# Patient Record
Sex: Female | Born: 1970
Health system: Southern US, Community
[De-identification: ages and names within clinical notes are randomized; demographics above are authoritative.]

## PROBLEM LIST (undated history)

## (undated) DIAGNOSIS — I7300 Raynaud's syndrome without gangrene: Secondary | ICD-10-CM

## (undated) DIAGNOSIS — I10 Essential (primary) hypertension: Secondary | ICD-10-CM

## (undated) DIAGNOSIS — J309 Allergic rhinitis, unspecified: Secondary | ICD-10-CM

## (undated) DIAGNOSIS — M7660 Achilles tendinitis, unspecified leg: Secondary | ICD-10-CM

## (undated) DIAGNOSIS — T7840XA Allergy, unspecified, initial encounter: Secondary | ICD-10-CM

## (undated) HISTORY — DX: Essential (primary) hypertension: I10

## (undated) HISTORY — DX: Allergy, unspecified, initial encounter: T78.40XA

## (undated) HISTORY — DX: Raynaud's syndrome without gangrene: I73.00

## (undated) HISTORY — DX: Allergic rhinitis, unspecified: J30.9

## (undated) HISTORY — DX: Achilles tendinitis, unspecified leg: M76.60

## (undated) HISTORY — PX: DILATION AND CURETTAGE OF UTERUS: SHX78

---

## 2004-12-05 ENCOUNTER — Inpatient Hospital Stay: Payer: Self-pay | Admitting: Unknown Physician Specialty

## 2005-07-05 ENCOUNTER — Ambulatory Visit: Payer: Self-pay | Admitting: Unknown Physician Specialty

## 2008-08-07 ENCOUNTER — Ambulatory Visit: Payer: Self-pay

## 2008-08-12 ENCOUNTER — Ambulatory Visit: Payer: Self-pay

## 2009-09-15 LAB — HM MAMMOGRAPHY: HM MAMMO: NORMAL (ref 0–4)

## 2015-01-11 ENCOUNTER — Ambulatory Visit
Admit: 2015-01-11 | Disposition: A | Discharge status: Home / Self Care | Payer: Self-pay | Attending: Internal Medicine | Admitting: Internal Medicine

## 2015-01-11 LAB — RAPID STREP-A WITH REFLX: Micro Text Report: POSITIVE

## 2016-04-15 ENCOUNTER — Encounter: Payer: Self-pay | Admitting: Family Medicine

## 2016-04-15 ENCOUNTER — Ambulatory Visit (INDEPENDENT_AMBULATORY_CARE_PROVIDER_SITE_OTHER): Payer: BLUE CROSS/BLUE SHIELD | Admitting: Family Medicine

## 2016-04-15 VITALS — BP 129/85 | HR 62 | Temp 98.1°F | Ht 59.0 in | Wt 145.4 lb

## 2016-04-15 DIAGNOSIS — M7662 Achilles tendinitis, left leg: Secondary | ICD-10-CM

## 2016-04-15 MED ORDER — NAPROXEN 500 MG PO TABS: 500.0000 mg | ORAL_TABLET | Freq: Two times a day (BID) | ORAL | Status: DC

## 2016-04-15 NOTE — Progress Notes (Signed)
BP 129/85 mmHg  Pulse 62  Temp(Src) 98.1 F (36.7 C)  Ht 4\' 11"  (1.499 m)  Wt 145 lb 6.4 oz (65.953 kg)  BMI 29.35 kg/m2  SpO2 95%  LMP 03/29/2016 (Approximate)   Subjective:    Patient ID: Sherry Bass, female    DOB: [birth year], 45 y.o.   MRN: [id number]  HPI: Sherry Bass is a 45 y.o. female  Chief Complaint  Patient presents with  . Foot Pain    Left Heel   FOOT PAIN Duration: Since Winter Involved foot: left Mechanism of injury: Running a lot on the treadmill with not such great shoes, feels different from the plantar fasciitis. Location: back of the heel and into the bottom Onset: gradual  Severity: severe  Quality:  burning Frequency: intermittent Radiation: up the leg a little bit Aggravating factors: weight bearing, walking and running  Alleviating factors: rest  Status: worse Treatments attempted: rest, ice, ibuprofen and aleve  Relief with NSAIDs?:  mild Weakness with weight bearing or walking: no Morning stiffness: yes Swelling: no Redness: no Bruising: yes Paresthesias / decreased sensation: yes  Fevers:no  Relevant past medical, surgical, family and social history reviewed and updated as indicated. Interim medical history since our last visit reviewed. Allergies and medications reviewed and updated.  Review of Systems  Constitutional: Negative.   Respiratory: Negative.   Cardiovascular: Negative.   Musculoskeletal: Positive for joint swelling, arthralgias and gait problem. Negative for myalgias, back pain, neck pain and neck stiffness.  Psychiatric/Behavioral: Negative.     Per HPI unless specifically indicated above     Objective:    BP 129/85 mmHg  Pulse 62  Temp(Src) 98.1 F (36.7 C)  Ht 4\' 11"  (1.499 m)  Wt 145 lb 6.4 oz (65.953 kg)  BMI 29.35 kg/m2  SpO2 95%  LMP 03/29/2016 (Approximate)  Wt Readings from Last 3 Encounters:  04/15/16 145 lb 6.4 oz (65.953 kg)  01/07/14 128 lb (58.06 kg)    Physical Exam   Constitutional: She is oriented to person, place, and time. She appears well-developed and well-nourished. No distress.  HENT:  Head: Normocephalic and atraumatic.  Right Ear: Hearing normal.  Left Ear: Hearing normal.  Nose: Nose normal.  Eyes: Conjunctivae and lids are normal. Right eye exhibits no discharge. Left eye exhibits no discharge. No scleral icterus.  Pulmonary/Chest: Effort normal. No respiratory distress.  Musculoskeletal:  + squeeze test, no tenderness to plantar fascia, FROM of the foot, no tenderness to palpation, no swelling, no redness  Neurological: She is alert and oriented to person, place, and time.  Skin: Skin is warm, dry and intact. No rash noted. No erythema. No pallor.  Psychiatric: She has a normal mood and affect. Her speech is normal and behavior is normal. Judgment and thought content normal. Cognition and memory are normal.  Nursing note and vitals reviewed.   Results for orders placed or performed in visit on 04/13/16  HM MAMMOGRAPHY  Result Value Ref Range   HM Mammogram Self Reported Normal 0-4 Bi-Rad, Self Reported Normal      Assessment & Plan:   Problem List Items Addressed This Visit    None    Visit Diagnoses    Tendonitis, Achilles, left    -  Primary    Will start naproxen. Stretches given. Referral to podiatry given today. Call with concerns. RICE.     Relevant Orders    Ambulatory referral to Podiatry        Follow up plan: Return  if symptoms worsen or fail to improve.

## 2016-04-15 NOTE — Patient Instructions (Signed)

## 2016-04-19 ENCOUNTER — Encounter: Payer: Self-pay | Admitting: Podiatry

## 2016-05-03 NOTE — Patient Instructions (Signed)

## 2016-05-04 ENCOUNTER — Encounter: Payer: Self-pay | Admitting: Podiatry

## 2016-05-04 ENCOUNTER — Ambulatory Visit (INDEPENDENT_AMBULATORY_CARE_PROVIDER_SITE_OTHER): Payer: BLUE CROSS/BLUE SHIELD | Admitting: Podiatry

## 2016-05-04 ENCOUNTER — Telehealth: Payer: Self-pay | Admitting: *Deleted

## 2016-05-04 ENCOUNTER — Ambulatory Visit (INDEPENDENT_AMBULATORY_CARE_PROVIDER_SITE_OTHER): Payer: BLUE CROSS/BLUE SHIELD

## 2016-05-04 VITALS — BP 114/88 | HR 69 | Resp 12

## 2016-05-04 DIAGNOSIS — M7662 Achilles tendinitis, left leg: Secondary | ICD-10-CM | POA: Diagnosis not present

## 2016-05-04 MED ORDER — NITROGLYCERIN 0.2 MG/HR TD PT24: 0.2000 mg | MEDICATED_PATCH | Freq: Every day | TRANSDERMAL | 12 refills | Status: DC

## 2016-05-04 MED ORDER — METHYLPREDNISOLONE 4 MG PO TBPK: ORAL_TABLET | ORAL | 0 refills | Status: DC

## 2016-05-04 MED ORDER — MELOXICAM 15 MG PO TABS: 15.0000 mg | ORAL_TABLET | Freq: Every day | ORAL | 3 refills | Status: DC

## 2016-05-04 NOTE — Progress Notes (Signed)
   Subjective:    Patient ID: Sherry Bass, female    DOB: May 11, 1971, 45 y.o.   MRN: [id number]  HPI: She presents today as a new patient with a chief complaint of six-month duration of pain to her left Achilles tendon. She states it really hurts when she runs and she noticed to have began to hurt after she ran a long distance. She's tried different shoe gear to no avail she's tried icing and stabilization to know L.    Review of Systems  Musculoskeletal: Positive for gait problem.       Objective:   Physical Exam: Vital signs are stable alert and oriented 3 and reviewed her past history medications and allergies. No apparent distress. She walks with an antalgic gait to the left. Pulses are strongly palpable. Neurologic sensorium is intact person's once the monofilament. Deep tendon reflexes are intact bilaterally muscle strength +5 over 5 dorsiflexion plantar flexors and inverters and everters all of his musculature is intact. Orthopedic evaluation demonstrates pinpoint tenderness along the tendo Achilles as it inserts on the posterior superior aspect of the calcaneus and posterior lateral aspect. Radiographs 3 views of the left foot taken today do not demonstrate any type of osseus abnormalities or hardly any thickening of the Achilles tendon itself. Cutaneous evaluation does not demonstrates any type of open lesions or wounds.        Assessment & Plan:  Insertional Achilles tendinitis left.  Plan: I placed her on a Sterapred dose pack to be followed by meloxicam. Placed her in a night splint and a Cam Walker. I wrote a prescription also for nitroglycerin patch to be applied daily at the site of tenderness. Follow up with her in 1 month. We did discuss appropriate shoe gear stretching excised and ice therapy and a handout was applied.

## 2016-05-04 NOTE — Telephone Encounter (Signed)
Pt states she was given 3 medications today and wanted to get the instructions right.  I told pt to begin the Medrol dose pack tomorrow so she could take as instructed, once completing the pack she could start the Meloxicam and could begin the nitroglycerin patches today.  Pt states understanding and asked if it was normal for her heel to burn in the boot.  I told her yes it is stretching the tendon, she should rest and ice at least 3-4 times daily for 15-20 minutes each time.

## 2016-05-06 ENCOUNTER — Telehealth: Payer: Self-pay | Admitting: *Deleted

## 2016-05-06 NOTE — Telephone Encounter (Signed)
Pt states she has taken 4 of the steroids and had a violent headache, with nausea and vomiting.  I told pt to stop the steroids and begin the Meloxicam.  I told pt not to take the meloxicam with other NSAIDS, may use Tylenol OTC if tolerates for additional pain coverage.

## 2016-06-01 ENCOUNTER — Ambulatory Visit: Payer: BLUE CROSS/BLUE SHIELD | Admitting: Podiatry

## 2016-06-01 ENCOUNTER — Ambulatory Visit (INDEPENDENT_AMBULATORY_CARE_PROVIDER_SITE_OTHER): Payer: BLUE CROSS/BLUE SHIELD | Admitting: Podiatry

## 2016-06-01 ENCOUNTER — Encounter: Payer: Self-pay | Admitting: Podiatry

## 2016-06-01 DIAGNOSIS — M7662 Achilles tendinitis, left leg: Secondary | ICD-10-CM

## 2016-06-01 NOTE — Progress Notes (Signed)
She presents today for follow-up of Achilles tendinitis of her left heel. She states that she is very much better however she still has radiating pain from the cervical nerve central posterior calf distally. She relates a history of sciatica.  Objective: Vital signs are stable alert and oriented 3 she doesn't have a lot of pain on palpation of the Achilles at its insertion site and it is not warm to the touch as it was previously. She still has radiating pain on palpation of the sural nerve just distal to the aponeurosis.  Assessment: Insertional Achilles tendinitis left. Neuritis left.  Plan: We'll send her to physical therapy for rehabilitation of her Achilles tendon and sciatica.

## 2016-06-10 DIAGNOSIS — M7662 Achilles tendinitis, left leg: Secondary | ICD-10-CM | POA: Diagnosis not present

## 2016-06-15 DIAGNOSIS — M7662 Achilles tendinitis, left leg: Secondary | ICD-10-CM | POA: Diagnosis not present

## 2016-06-17 DIAGNOSIS — M7662 Achilles tendinitis, left leg: Secondary | ICD-10-CM | POA: Diagnosis not present

## 2016-06-22 DIAGNOSIS — M7662 Achilles tendinitis, left leg: Secondary | ICD-10-CM | POA: Diagnosis not present

## 2016-06-24 ENCOUNTER — Encounter: Payer: Self-pay | Admitting: Family Medicine

## 2016-06-24 ENCOUNTER — Ambulatory Visit (INDEPENDENT_AMBULATORY_CARE_PROVIDER_SITE_OTHER): Payer: BLUE CROSS/BLUE SHIELD | Admitting: Family Medicine

## 2016-06-24 VITALS — BP 131/84 | HR 94 | Temp 98.7°F | Ht 58.7 in | Wt 144.0 lb

## 2016-06-24 DIAGNOSIS — R52 Pain, unspecified: Secondary | ICD-10-CM | POA: Diagnosis not present

## 2016-06-24 DIAGNOSIS — J069 Acute upper respiratory infection, unspecified: Secondary | ICD-10-CM

## 2016-06-24 LAB — VERITOR FLU A/B WAIVED
INFLUENZA B: NEGATIVE
Influenza A: NEGATIVE

## 2016-06-24 MED ORDER — BENZONATATE 100 MG PO CAPS: 100.0000 mg | ORAL_CAPSULE | Freq: Two times a day (BID) | ORAL | 0 refills | Status: DC | PRN

## 2016-06-24 MED ORDER — FLUTICASONE PROPIONATE 50 MCG/ACT NA SUSP: 2.0000 | Freq: Every day | NASAL | 6 refills | Status: DC | PRN

## 2016-06-24 MED ORDER — ALBUTEROL SULFATE HFA 108 (90 BASE) MCG/ACT IN AERS: 2.0000 | INHALATION_SPRAY | Freq: Four times a day (QID) | RESPIRATORY_TRACT | 0 refills | Status: DC | PRN

## 2016-06-24 NOTE — Patient Instructions (Signed)
Follow up as needed

## 2016-06-24 NOTE — Progress Notes (Signed)
   BP 131/84 (BP Location: Left Arm, Patient Position: Sitting, Cuff Size: Normal)   Pulse 94   Temp 98.7 F (37.1 C)   Ht 4' 10.7" (1.491 m)   Wt 144 lb (65.3 kg)   LMP 06/22/2016 (Exact Date)   SpO2 94%   BMI 29.38 kg/m    Subjective:    Patient ID: Creig HinesCindy Louise Jeffords, female    DOB: 04-Jan-1971, 45 y.o.   MRN: [id number]  HPI: Creig HinesCindy Louise Brix is a 45 y.o. female  Chief Complaint  Patient presents with  . Generalized Body Aches    fever   Patient presents with body aches and low grade fever (99) that started Monday. Chest tightness, sinus pressure, HA and has a productive cough. Denies sore throat, ear pain, nasal congestion. Has been taking tylenol as needed and flonase. Tried zyrtec a couple of times. 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   Relevant past medical, surgical, family and social history reviewed and updated as indicated. Interim medical history since our last visit reviewed. Allergies and medications reviewed and updated.  Review of Systems  Constitutional: Positive for fever.  HENT: Positive for sinus pressure.   Eyes: Negative.   Respiratory: Positive for cough and wheezing.   Gastrointestinal: Negative.   Genitourinary: Negative.   Musculoskeletal: Positive for myalgias.  Skin: Negative.   Neurological: Positive for headaches.  Psychiatric/Behavioral: Negative.     Per HPI unless specifically indicated above     Objective:    BP 131/84 (BP Location: Left Arm, Patient Position: Sitting, Cuff Size: Normal)   Pulse 94   Temp 98.7 F (37.1 C)   Ht 4' 10.7" (1.491 m)   Wt 144 lb (65.3 kg)   LMP 06/22/2016 (Exact Date)   SpO2 94%   BMI 29.38 kg/m   Wt Readings from Last 3 Encounters:  06/24/16 144 lb (65.3 kg)  04/15/16 145 lb 6.4 oz (66 kg)  01/07/14 128 lb (58.1 kg)    Physical Exam  Constitutional: She is oriented to person, place, and time. She appears well-developed and  well-nourished. No distress.  HENT:  Head: Atraumatic.  Eyes: Conjunctivae are normal. No scleral icterus.  Neck: Normal range of motion. Neck supple.  Cardiovascular: Normal rate and normal heart sounds.   Pulmonary/Chest: Effort normal. No respiratory distress. She has wheezes (mild wheezes throughout).  Musculoskeletal: Normal range of motion.  Neurological: She is alert and oriented to person, place, and time.  Skin: Skin is warm and dry.  Psychiatric: She has a normal mood and affect. Her behavior is normal.  Nursing note and vitals reviewed.     Assessment & Plan:   Problem List Items Addressed This Visit    None    Visit Diagnoses    URI (upper respiratory infection)    -  Primary   Likely viral, flonase, tessalon perles, albuterol, and tylenol as needed. She will follow up if symptoms worsen or fail to improve by early next week   Relevant Orders   Influenza A & B (STAT)       Follow up plan: Return if symptoms worsen or fail to improve.

## 2016-06-27 ENCOUNTER — Telehealth: Payer: Self-pay

## 2016-06-27 DIAGNOSIS — J019 Acute sinusitis, unspecified: Secondary | ICD-10-CM | POA: Diagnosis not present

## 2016-06-27 MED ORDER — AMOXICILLIN-POT CLAVULANATE 875-125 MG PO TABS: 1.0000 | ORAL_TABLET | Freq: Two times a day (BID) | ORAL | 0 refills | Status: DC

## 2016-06-27 NOTE — Telephone Encounter (Signed)
She was seen on Friday and left message stating that she's no better and would like a rx for Amoxil.

## 2016-06-27 NOTE — Telephone Encounter (Signed)
Augmentin sent.

## 2016-06-30 DIAGNOSIS — M7662 Achilles tendinitis, left leg: Secondary | ICD-10-CM | POA: Diagnosis not present

## 2016-07-01 DIAGNOSIS — J189 Pneumonia, unspecified organism: Secondary | ICD-10-CM | POA: Diagnosis not present

## 2016-07-07 DIAGNOSIS — M7662 Achilles tendinitis, left leg: Secondary | ICD-10-CM | POA: Diagnosis not present

## 2016-07-08 DIAGNOSIS — J189 Pneumonia, unspecified organism: Secondary | ICD-10-CM | POA: Diagnosis not present

## 2016-07-12 DIAGNOSIS — M7662 Achilles tendinitis, left leg: Secondary | ICD-10-CM | POA: Diagnosis not present

## 2016-07-14 DIAGNOSIS — M7662 Achilles tendinitis, left leg: Secondary | ICD-10-CM | POA: Diagnosis not present

## 2016-07-22 DIAGNOSIS — M7662 Achilles tendinitis, left leg: Secondary | ICD-10-CM | POA: Diagnosis not present

## 2016-07-25 DIAGNOSIS — J189 Pneumonia, unspecified organism: Secondary | ICD-10-CM | POA: Diagnosis not present

## 2016-07-25 DIAGNOSIS — Z3200 Encounter for pregnancy test, result unknown: Secondary | ICD-10-CM | POA: Diagnosis not present

## 2016-07-26 ENCOUNTER — Ambulatory Visit
Admission: EM | Admit: 2016-07-26 | Discharge: 2016-07-26 | Disposition: A | Discharge status: Home / Self Care | Payer: BLUE CROSS/BLUE SHIELD | Attending: Family Medicine | Admitting: Family Medicine

## 2016-07-26 DIAGNOSIS — J189 Pneumonia, unspecified organism: Secondary | ICD-10-CM

## 2016-07-26 MED ORDER — LEVOFLOXACIN 500 MG PO TABS: 500.0000 mg | ORAL_TABLET | Freq: Every day | ORAL | 0 refills | Status: DC

## 2016-07-26 NOTE — ED Provider Notes (Signed)
[facility]    CSN: [id number] Arrival date & time: 07/26/16  1251     History   Chief Complaint Chief Complaint  Patient presents with  . Pneumonia    HPI Sherry Bass is a 45 y.o. female.   45 yo female with a c/o productive cough, fevers, fatigue. States was seen by PCP in the past several weeks and treated with amoxicillin for pneumonia. Patient states she improved only slightly, went to urgent care and had a repeat chest x-ray showing pneumonia. Patient states she offered prednisone but that she can't take that.     The history is provided by the patient.    Past Medical History:  Diagnosis Date  . Allergic rhinitis     There are no active problems to display for this patient.   Past Surgical History:  Procedure Laterality Date  . CESAREAN SECTION     x 2    OB History    No data available       Home Medications    Prior to Admission medications   Medication Sig Start Date End Date Taking? Authorizing Provider  albuterol (PROVENTIL HFA;VENTOLIN HFA) 108 (90 Base) MCG/ACT inhaler Inhale 2 puffs into the lungs every 6 (six) hours as needed for wheezing or shortness of breath. 06/24/16  Yes Particia Nearing, PA-C  fluticasone [facility]) 50 MCG/ACT nasal spray Place 2 sprays into both nostrils daily as needed. 06/24/16  Yes Particia Nearing, PA-C  meloxicam (MOBIC) 15 MG tablet Take 1 tablet (15 mg total) by mouth daily. 05/04/16  Yes Max T Hyatt, DPM  amoxicillin-clavulanate (AUGMENTIN) 875-125 MG tablet Take 1 tablet by mouth 2 (two) times daily. 06/27/16   Particia Nearing, PA-C  benzonatate (TESSALON) 100 MG capsule Take 1 capsule (100 mg total) by mouth 2 (two) times daily as needed for cough. 06/24/16   Particia Nearing, PA-C  levofloxacin (LEVAQUIN) 500 MG tablet Take 1 tablet (500 mg total) by mouth daily. 07/26/16   Payton Mccallum, MD  nitroGLYCERIN (NITRODUR - DOSED IN MG/24 HR) 0.2 mg/hr patch Place 1 patch (0.2 mg  total) onto the skin daily. 05/04/16   Max Maud Deed, DPM    Family History Family History  Problem Relation Age of Onset  . Diabetes Mother   . Hypertension Mother   . Hyperlipidemia Father     Social History Social History  Substance Use Topics  . Smoking status: Never Smoker  . Smokeless tobacco: Never Used  . Alcohol use No     Allergies   Prednisone   Review of Systems Review of Systems   Physical Exam Triage Vital Signs ED Triage Vitals  Enc Vitals Group     BP 07/26/16 1339 (!) 163/88     Pulse Rate 07/26/16 1339 74     Resp 07/26/16 1339 18     Temp 07/26/16 1339 97.7 F (36.5 C)     Temp Source 07/26/16 1339 Oral     SpO2 07/26/16 1339 99 %     Weight 07/26/16 1338 140 lb (63.5 kg)     Height 07/26/16 1338 4\' 10"  (1.473 m)     Head Circumference --      Peak Flow --      Pain Score 07/26/16 1341 7     Pain Loc --      Pain Edu? --      Excl. in GC? --    No data found.   Updated Vital Signs  BP (!) 163/88 (BP Location: Right Arm)   Pulse 74   Temp 97.7 F (36.5 C) (Oral)   Resp 18   Ht 4\' 10"  (1.473 m)   Wt 140 lb (63.5 kg)   LMP 07/20/2016   SpO2 99%   BMI 29.26 kg/m   Visual Acuity Right Eye Distance:   Left Eye Distance:   Bilateral Distance:    Right Eye Near:   Left Eye Near:    Bilateral Near:     Physical Exam  Constitutional: She appears well-developed and well-nourished. No distress.  HENT:  Head: Normocephalic and atraumatic.  Right Ear: Tympanic membrane, external ear and ear canal normal.  Left Ear: Tympanic membrane, external ear and ear canal normal.  Nose: Mucosal edema and rhinorrhea present. No nose lacerations, sinus tenderness, nasal deformity, septal deviation or nasal septal hematoma. No epistaxis.  No foreign bodies. Right sinus exhibits maxillary sinus tenderness and frontal sinus tenderness. Left sinus exhibits maxillary sinus tenderness and frontal sinus tenderness.  Mouth/Throat: Uvula is midline,  oropharynx is clear and moist and mucous membranes are normal. No oropharyngeal exudate.  Eyes: Conjunctivae and EOM are normal. Pupils are equal, round, and reactive to light. Right eye exhibits no discharge. Left eye exhibits no discharge. No scleral icterus.  Neck: Normal range of motion. Neck supple. No thyromegaly present.  Cardiovascular: Normal rate, regular rhythm and normal heart sounds.   Pulmonary/Chest: Effort normal. No respiratory distress. She has no wheezes. She has rales (left lower lung).  Lymphadenopathy:    She has no cervical adenopathy.  Skin: She is not diaphoretic.  Nursing note and vitals reviewed.    UC Treatments / Results  Labs (all labs ordered are listed, but only abnormal results are displayed) Labs Reviewed - No data to display  EKG  EKG Interpretation None       Radiology No results found.  Procedures Procedures (including critical care time)  Medications Ordered in UC Medications - No data to display   Initial Impression / Assessment and Plan / UC Course  I have reviewed the triage vital signs and the nursing notes.  Pertinent labs & imaging results that were available during my care of the patient were reviewed by me and considered in my medical decision making (see chart for details).  Clinical Course      Final Clinical Impressions(s) / UC Diagnoses   Final diagnoses:  Community acquired pneumonia, unspecified laterality    New Prescriptions Discharge Medication List as of 07/26/2016  2:05 PM    START taking these medications   Details  levofloxacin (LEVAQUIN) 500 MG tablet Take 1 tablet (500 mg total) by mouth daily., Starting Tue 07/26/2016, Normal       1. diagnosis reviewed with patient/parent/guardian/family 2. rx as per orders above; reviewed possible side effects, interactions, risks and benefits  3. Recommend supportive treatment with rest, fluids 4. Follow-up with PCP in 2-3 weeks or sooner prn if symptoms  worsen or don't improve   Payton Mccallumrlando Kamron Portee, MD 07/26/16 1547

## 2016-07-26 NOTE — ED Triage Notes (Signed)
Pt c/o having pneumonia. She has been feeling bad for over a month. She has seen her PCP Dr Aline Augustrissmon and he said her O2 was low and perscribed her an inhaler. She didn't feel any better in a few days so [facility] and they did 2 chest xray and diagnosis her with pneumonia, they gave her amoxicillin, mucinex, and inhaler. She finished the antibiotic however didn't feel any better, so [facility] and they did another chest x ray and it showed she did still have pneumonia. They wanted to start more antibiotic and steroids, however she cant take prednisone, because it makes her vomit, so at that time they said well you will have to just wait it out then.

## 2016-07-29 ENCOUNTER — Telehealth: Payer: Self-pay | Admitting: *Deleted

## 2016-07-29 NOTE — Telephone Encounter (Signed)
Courtesy call back, no answer, unable to leave message, voicemail unavailable.

## 2016-08-25 DIAGNOSIS — J189 Pneumonia, unspecified organism: Secondary | ICD-10-CM | POA: Diagnosis not present

## 2016-08-25 DIAGNOSIS — Z3200 Encounter for pregnancy test, result unknown: Secondary | ICD-10-CM | POA: Diagnosis not present

## 2017-05-15 ENCOUNTER — Telehealth: Payer: Self-pay | Admitting: Unknown Physician Specialty

## 2017-05-15 MED ORDER — FLUTICASONE PROPIONATE 50 MCG/ACT NA SUSP: 2.0000 | Freq: Every day | NASAL | 12 refills | Status: DC

## 2017-05-15 NOTE — Telephone Encounter (Signed)
Routing to provider. Patient last seen by Sherry Bass 12/2013. Patient saw [name] 06/2016.

## 2017-05-15 NOTE — Telephone Encounter (Signed)
Patient called to see if Elnita MaxwellCheryl would send her in a script for her fluticasone (flonase)  [facility]  [place] [phone]  Thanks

## 2017-11-16 ENCOUNTER — Ambulatory Visit (INDEPENDENT_AMBULATORY_CARE_PROVIDER_SITE_OTHER): Payer: BLUE CROSS/BLUE SHIELD | Admitting: Obstetrics and Gynecology

## 2017-11-16 ENCOUNTER — Other Ambulatory Visit: Payer: Self-pay | Admitting: Obstetrics and Gynecology

## 2017-11-16 ENCOUNTER — Encounter: Payer: Self-pay | Admitting: Obstetrics and Gynecology

## 2017-11-16 VITALS — BP 130/90 | Ht <= 58 in | Wt 157.0 lb

## 2017-11-16 DIAGNOSIS — Z1321 Encounter for screening for nutritional disorder: Secondary | ICD-10-CM | POA: Diagnosis not present

## 2017-11-16 DIAGNOSIS — Z01419 Encounter for gynecological examination (general) (routine) without abnormal findings: Secondary | ICD-10-CM

## 2017-11-16 DIAGNOSIS — Z13220 Encounter for screening for lipoid disorders: Secondary | ICD-10-CM

## 2017-11-16 DIAGNOSIS — Z0000 Encounter for general adult medical examination without abnormal findings: Secondary | ICD-10-CM

## 2017-11-16 DIAGNOSIS — N6311 Unspecified lump in the right breast, upper outer quadrant: Secondary | ICD-10-CM | POA: Diagnosis not present

## 2017-11-16 DIAGNOSIS — Z131 Encounter for screening for diabetes mellitus: Secondary | ICD-10-CM

## 2017-11-16 DIAGNOSIS — Z1231 Encounter for screening mammogram for malignant neoplasm of breast: Secondary | ICD-10-CM | POA: Diagnosis not present

## 2017-11-16 DIAGNOSIS — Z1151 Encounter for screening for human papillomavirus (HPV): Secondary | ICD-10-CM

## 2017-11-16 DIAGNOSIS — Z124 Encounter for screening for malignant neoplasm of cervix: Secondary | ICD-10-CM

## 2017-11-16 DIAGNOSIS — Z1239 Encounter for other screening for malignant neoplasm of breast: Secondary | ICD-10-CM

## 2017-11-16 NOTE — Patient Instructions (Signed)
I value your feedback and entrusting us with your care. If you get a [facility] patient survey, I would appreciate you taking the time to let us know about your experience today. Thank you! 

## 2017-11-16 NOTE — Progress Notes (Signed)
PCP:  Gabriel CirriWicker, Cheryl, NP   Chief Complaint  Patient presents with  . Gynecologic Exam     HPI:      Sherry Bass is a 47 y.o. [id number] who LMP was Patient's last menstrual period was 10/23/2017 (exact date)., presents today for her NP annual examination.  Her menses are regular every 28-30 days, lasting 4 days.  Dysmenorrhea none. She does not have intermenstrual bleeding.  Sex activity: single partner, contraception - vasectomy.  Last Pap: not recent; no hx of abn Hx of STDs: none  Last mammogram: not recent; had abn mammo years ago that was normal [facility].  There is no FH of breast cancer. There is no FH of ovarian cancer. The patient does do regular self-breast exams. She noted a breast mass last night RT UOQ while doing SBE. Not tender, no redness, nipple d/c.   Tobacco use: The patient denies current or previous tobacco use. Alcohol use: none No drug use.  Exercise: moderately active  She does get adequate calcium and Vitamin D in her diet.  No recent labs.  Past Medical History:  Diagnosis Date  . Achilles tendonitis   . Allergic rhinitis     Past Surgical History:  Procedure Laterality Date  . CESAREAN SECTION     x 2    Family History  Problem Relation Age of Onset  . Diabetes Mother   . Hypertension Mother   . Hyperlipidemia Father     Social History   Socioeconomic History  . Marital status: Married    Spouse name: Not on file  . Number of children: Not on file  . Years of education: Not on file  . Highest education level: Not on file  Social Needs  . Financial resource strain: Not on file  . Food insecurity - worry: Not on file  . Food insecurity - inability: Not on file  . Transportation needs - medical: Not on file  . Transportation needs - non-medical: Not on file  Occupational History  . Not on file  Tobacco Use  . Smoking status: Never Smoker  . Smokeless tobacco: Never Used  Substance and Sexual  Activity  . Alcohol use: No  . Drug use: No  . Sexual activity: Yes    Birth control/protection: None, Other-see comments    Comment: vasectomy  Other Topics Concern  . Not on file  Social History Narrative  . Not on file    Current Meds  Medication Sig  . fluticasone (FLONASE) 50 MCG/ACT nasal spray Place 2 sprays into both nostrils daily.     ROS:  Review of Systems  Constitutional: Negative for fatigue, fever and unexpected weight change.  Respiratory: Negative for cough, shortness of breath and wheezing.   Cardiovascular: Negative for chest pain, palpitations and leg swelling.  Gastrointestinal: Negative for blood in stool, constipation, diarrhea, nausea and vomiting.  Endocrine: Negative for cold intolerance, heat intolerance and polyuria.  Genitourinary: Negative for dyspareunia, dysuria, flank pain, frequency, genital sores, hematuria, menstrual problem, pelvic pain, urgency, vaginal bleeding, vaginal discharge and vaginal pain.  Musculoskeletal: Negative for back pain, joint swelling and myalgias.  Skin: Negative for rash.  Neurological: Negative for dizziness, syncope, light-headedness, numbness and headaches.  Hematological: Negative for adenopathy.  Psychiatric/Behavioral: Negative for agitation, confusion, sleep disturbance and suicidal ideas. The patient is not nervous/anxious.   Breast ROS: positive for - new or changing breast lumps    Objective: BP 130/90   Ht 4'  9.5" (1.461 m)   Wt 157 lb (71.2 kg)   LMP 10/23/2017 (Exact Date)   BMI 33.39 kg/m    Physical Exam  Constitutional: She is oriented to person, place, and time. She appears well-developed and well-nourished.  Genitourinary: Vagina normal and uterus normal. There is no rash or tenderness on the right labia. There is no rash or tenderness on the left labia. No erythema or tenderness in the vagina. No vaginal discharge found. Right adnexum does not display mass and does not display tenderness.  Left adnexum does not display mass and does not display tenderness. Cervix does not exhibit motion tenderness or polyp. Uterus is not enlarged or tender.  Neck: Normal range of motion. No thyromegaly present.  Cardiovascular: Normal rate, regular rhythm and normal heart sounds.  No murmur heard. Pulmonary/Chest: Effort normal and breath sounds normal. Right breast exhibits mass. Right breast exhibits no nipple discharge, no skin change and no tenderness. Left breast exhibits no mass, no nipple discharge, no skin change and no tenderness.  ~4X5 CM FIRM, MOBILE, NT MASS RT BREAST 10:00-11:00 POS    Abdominal: Soft. There is no tenderness. There is no guarding.  Musculoskeletal: Normal range of motion.  Neurological: She is alert and oriented to person, place, and time. No cranial nerve deficit.  Skin: Skin is warm and dry.  Psychiatric: She has a normal mood and affect. Her behavior is normal. Thought content normal.  Vitals reviewed.   Assessment/Plan: Encounter for annual routine gynecological examination  Cervical cancer screening - Plan: IGP, Aptima HPV  Screening for HPV (human papillomavirus) - Plan: IGP, Aptima HPV  Screening for breast cancer - Plan: MM DIAG BREAST TOMO BILATERAL  Mass of upper outer quadrant of right breast - Check dx mammo and u/s. Pt prefers [facility]. Will f/u with results.  - Plan: US BREAST LTD UNI RIGHT INC AXILLA, MM DIAG BREAST TOMO BILATERAL  Blood tests for routine general physical examination - Plan: Comprehensive metabolic panel, Lipid panel, VITAMIN D 25 Hydroxy (Vit-D Deficiency, Fractures), Hemoglobin A1c  Screening cholesterol level - Plan: Lipid panel  Screening for diabetes mellitus - Plan: Hemoglobin A1c  Encounter for vitamin deficiency screening - Plan: VITAMIN D 25 Hydroxy (Vit-D Deficiency, Fractures)   GYN counsel breast self exam, mammography screening, adequate intake of calcium and vitamin D, diet and exercise     F/U  Return in  about 1 year (around 11/16/2018).  Sherry B. Copland, PA-C 11/16/2017 4:34 PM

## 2017-11-20 LAB — IGP, APTIMA HPV
HPV Aptima: NEGATIVE
PAP Smear Comment: 0

## 2017-12-01 DIAGNOSIS — N6311 Unspecified lump in the right breast, upper outer quadrant: Secondary | ICD-10-CM | POA: Diagnosis not present

## 2017-12-05 ENCOUNTER — Ambulatory Visit: Payer: BLUE CROSS/BLUE SHIELD | Admitting: Certified Nurse Midwife

## 2018-10-02 ENCOUNTER — Other Ambulatory Visit: Payer: Self-pay

## 2018-10-02 ENCOUNTER — Encounter: Payer: Self-pay | Admitting: Emergency Medicine

## 2018-10-02 ENCOUNTER — Ambulatory Visit
Admission: EM | Admit: 2018-10-02 | Discharge: 2018-10-02 | Disposition: A | Discharge status: Home / Self Care | Payer: BLUE CROSS/BLUE SHIELD | Attending: Family Medicine | Admitting: Family Medicine

## 2018-10-02 DIAGNOSIS — J020 Streptococcal pharyngitis: Secondary | ICD-10-CM | POA: Insufficient documentation

## 2018-10-02 DIAGNOSIS — J069 Acute upper respiratory infection, unspecified: Secondary | ICD-10-CM

## 2018-10-02 LAB — RAPID STREP SCREEN (MED CTR MEBANE ONLY): STREPTOCOCCUS, GROUP A SCREEN (DIRECT): POSITIVE — AB

## 2018-10-02 MED ORDER — AMOXICILLIN 875 MG PO TABS: 875.0000 mg | ORAL_TABLET | Freq: Two times a day (BID) | ORAL | 0 refills | Status: DC

## 2018-10-02 NOTE — ED Triage Notes (Signed)
Patient c/o sore throat, cough and congestion that started 3 days ago.  Patient reports low grade fevers.

## 2018-10-02 NOTE — Discharge Instructions (Addendum)
Take medication as prescribed. Rest. Drink plenty of fluids. Over the counter as discussed.  ° °Follow up with your primary care physician this week as needed. Return to Urgent care for new or worsening concerns.  ° °

## 2018-10-02 NOTE — ED Provider Notes (Signed)
[facility] ____________________________________________  Time seen: Approximately 08:30 AM  I have reviewed the triage vital signs and the nursing notes.   HISTORY  Chief Complaint Sore Throat (APPT) and Cough   HPI Sherry Bass is a 47 y.o. female presenting for evaluation of nasal congestion, cough and sore throat present for the last 3 days.  Reports yesterday sore throat increasingly worsened.  States sore throat is currently moderate and does hurt to swallow.  Has continued to drink fluids well, decreased appetite.  Also reports continued nasal congestion and cough.  Intermittent low-grade fevers, which patient reports around 99.  Has taken over-the-counter allergy medication as well as decongestants without resolution.  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  Denies other known sick contacts.  Denies chest pain, shortness breath, abdominal pain or recent sickness.  Reports otherwise doing well.  Gabriel CirriWicker, Cheryl, NP: PCP    Past Medical History:  Diagnosis Date  . Achilles tendonitis   . Allergic rhinitis     There are no active problems to display for this patient.   Past Surgical History:  Procedure Laterality Date  . CESAREAN SECTION     x 2     No current facility-administered medications for this encounter.   Current Outpatient Medications:  .  fluticasone (FLONASE) 50 MCG/ACT nasal spray, Place 2 sprays into both nostrils daily., Disp: 16 g, Rfl: 12 .  amoxicillin (AMOXIL) 875 MG tablet, Take 1 tablet (875 mg total) by mouth 2 (two) times daily., Disp: 20 tablet, Rfl: 0  Allergies Prednisone  Family History  Problem Relation Age of Onset  . Diabetes Mother   . Hypertension Mother   . Hyperlipidemia Father     Social History Social History   Tobacco Use  . Smoking status: Never Smoker  . Smokeless tobacco: Never Used  Substance Use Topics  . Alcohol use: No  . Drug use: No    Review of  Systems Constitutional: Positive reported fevers. ENT: Positive sore throat. Cardiovascular: Denies chest pain. Respiratory: Denies shortness of breath. Gastrointestinal: No abdominal pain.  Musculoskeletal: Negative for back pain. Skin: Negative for rash.  ____________________________________________   PHYSICAL EXAM:  VITAL SIGNS: ED Triage Vitals  Enc Vitals Group     BP 10/02/18 0820 (!) 140/91     Pulse Rate 10/02/18 0820 (!) 104     Resp 10/02/18 0820 16     Temp 10/02/18 0820 98.2 F (36.8 C)     Temp Source 10/02/18 0820 Oral     SpO2 10/02/18 0820 97 %     Weight 10/02/18 0817 152 lb (68.9 kg)     Height 10/02/18 0817 4\' 9"  (1.448 m)     Head Circumference --      Peak Flow --      Pain Score 10/02/18 0817 8     Pain Loc --      Pain Edu? --      Excl. in GC? --     Constitutional: Alert and oriented. Well appearing and in no acute distress. Eyes: Conjunctivae are normal.  Head: Atraumatic. No sinus tenderness to palpation. No swelling. No erythema.  Ears: no erythema, normal TMs bilaterally.   Nose:Nasal congestion  Mouth/Throat: Mucous membranes are moist.moderate pharyngeal erythema.  2+ bilateral tonsillar swelling.  No exudate.  No uvular shift or deviation. Neck: No stridor.  No cervical spine tenderness to palpation. Hematological/Lymphatic/Immunilogical: Anterior bilateral cervical lymphadenopathy. Cardiovascular: Normal rate, regular rhythm. Grossly normal  heart sounds.  Good peripheral circulation. Respiratory: Normal respiratory effort.  No retractions. No wheezes, rales or rhonchi. Good air movement.  Occasional dry cough noted in room. Musculoskeletal: Ambulatory with steady gait Neurologic:  Normal speech and language. No gait instability. Skin:  Skin appears warm, dry and intact. No rash noted. Psychiatric: Mood and affect are normal. Speech and behavior are normal. ___________________________________________   LABS (all labs ordered are  listed, but only abnormal results are displayed)  Labs Reviewed  RAPID STREP SCREEN ([facility] ONLY) - Abnormal; Notable for the following components:      Result Value   Streptococcus, Group A Screen (Direct) POSITIVE (*)    All other components within normal limits   ____________________________________________   PROCEDURES Procedures     INITIAL IMPRESSION / ASSESSMENT AND PLAN / ED COURSE  Pertinent labs & imaging results that were available during my care of the patient were reviewed by me and considered in my medical decision making (see chart for details).  Overall well-appearing patient.  No acute distress.  Strep positive.  Discussed treatment options with patient, will treat with oral amoxicillin.  Also suspect viral upper respiratory infection, encouraged over-the-counter supportive care.  Rest, fluids.  Discussed indication, risks and benefits of medications with patient.  Discussed follow up with Primary care physician this week as needed. Discussed follow up and return parameters including no resolution or any worsening concerns. Patient verbalized understanding and agreed to plan.   ____________________________________________   FINAL CLINICAL IMPRESSION(S) / ED DIAGNOSES  Final diagnoses:  Strep pharyngitis  Upper respiratory tract infection, unspecified type     ED Discharge Orders         Ordered    amoxicillin (AMOXIL) 875 MG tablet  2 times daily     10/02/18 0849           Note: This dictation was prepared with Dragon dictation along with smaller phrase technology. Any transcriptional errors that result from this process are unintentional.         Renford DillsMiller, Yuto Cajuste, NP 10/02/18 1032

## 2018-10-11 ENCOUNTER — Ambulatory Visit
Admission: EM | Admit: 2018-10-11 | Discharge: 2018-10-11 | Disposition: A | Discharge status: Home / Self Care | Payer: BLUE CROSS/BLUE SHIELD | Attending: Family Medicine | Admitting: Family Medicine

## 2018-10-11 ENCOUNTER — Ambulatory Visit (INDEPENDENT_AMBULATORY_CARE_PROVIDER_SITE_OTHER): Payer: BLUE CROSS/BLUE SHIELD

## 2018-10-11 DIAGNOSIS — R05 Cough: Secondary | ICD-10-CM

## 2018-10-11 DIAGNOSIS — J189 Pneumonia, unspecified organism: Secondary | ICD-10-CM

## 2018-10-11 MED ORDER — BENZONATATE 200 MG PO CAPS: ORAL_CAPSULE | ORAL | 0 refills | Status: DC

## 2018-10-11 MED ORDER — LEVOFLOXACIN 500 MG PO TABS: 500.0000 mg | ORAL_TABLET | Freq: Every day | ORAL | 0 refills | Status: DC

## 2018-10-11 MED ORDER — ALBUTEROL SULFATE 108 (90 BASE) MCG/ACT IN AEPB: 2.0000 | INHALATION_SPRAY | Freq: Four times a day (QID) | RESPIRATORY_TRACT | 0 refills | Status: DC | PRN

## 2018-10-11 NOTE — ED Triage Notes (Signed)
Pt states since christmas eve still battling the cough and chest tightness. Has 1 day left on her antibiotics.

## 2018-10-11 NOTE — ED Provider Notes (Signed)
[facility]    CSN: [id number] Arrival date & time: 10/11/18  0858     History   Chief Complaint Chief Complaint  Patient presents with  . Cough    Appointment    HPI Sherry Bass is a 48 y.o. female.   HPI  -year-old female presents that she has started having cough symptoms in around Thanksgiving.  She was exposed to incense type smoke which precipitated the cough.  Right Christmas time diagnosed with a strep pharyngitis placed on ampicillin which she has 1 more dose left to take.  Despite all this she continues to have the cough and chest congestion.  Noticed a rattling sound whenever she is recumbent at night.  She states that she has been coughing all day yesterday and  today.  Is not running fever or chills.  O2 sats today are 97% on room air.  She has never smoked.  Denies any sinus problems.         Past Medical History:  Diagnosis Date  . Achilles tendonitis   . Allergic rhinitis     There are no active problems to display for this patient.   Past Surgical History:  Procedure Laterality Date  . CESAREAN SECTION     x 2    OB History    Gravida  3   Para  2   Term  2   Preterm      AB  1   Living  2     SAB  1   TAB      Ectopic      Multiple      Live Births  2            Home Medications    Prior to Admission medications   Medication Sig Start Date End Date Taking? Authorizing Provider  amoxicillin (AMOXIL) 875 MG tablet Take 1 tablet (875 mg total) by mouth 2 (two) times daily. 10/02/18  Yes Renford Dills, NP  fluticasone [facility]) 50 MCG/ACT nasal spray Place 2 sprays into both nostrils daily. 05/15/17  Yes Gabriel Cirri, NP  Albuterol Sulfate (PROAIR RESPICLICK) 108 (90 Base) MCG/ACT AEPB Inhale 2 puffs into the lungs 4 (four) times daily as needed. Use spacer 10/11/18   Lutricia Feil, PA-C  benzonatate (TESSALON) 200 MG capsule Take one cap TID PRN cough 10/11/18   Lutricia Feil, PA-C    levofloxacin (LEVAQUIN) 500 MG tablet Take 1 tablet (500 mg total) by mouth daily. 10/11/18   Lutricia Feil, PA-C    Family History Family History  Problem Relation Age of Onset  . Diabetes Mother   . Hypertension Mother   . Hyperlipidemia Father     Social History Social History   Tobacco Use  . Smoking status: Never Smoker  . Smokeless tobacco: Never Used  Substance Use Topics  . Alcohol use: No  . Drug use: No     Allergies   Prednisone   Review of Systems Review of Systems  Constitutional: Positive for activity change. Negative for appetite change, chills, fatigue and fever.  HENT: Positive for congestion and sore throat.   Respiratory: Positive for cough and wheezing.   All other systems reviewed and are negative.    Physical Exam Triage Vital Signs ED Triage Vitals  Enc Vitals Group     BP 10/11/18 0908 (!) 158/108     Pulse Rate 10/11/18 0908 87     Resp 10/11/18 0908 18  Temp 10/11/18 0908 97.7 F (36.5 C)     Temp Source 10/11/18 0908 Oral     SpO2 10/11/18 0908 97 %     Weight 10/11/18 0910 150 lb (68 kg)     Height 10/11/18 0910 4\' 10"  (1.473 m)     Head Circumference --      Peak Flow --      Pain Score 10/11/18 0910 0     Pain Loc --      Pain Edu? --      Excl. in GC? --    No data found.  Updated Vital Signs BP (!) 158/108 (BP Location: Left Arm)   Pulse 87   Temp 97.7 F (36.5 C) (Oral)   Resp 18   Ht 4\' 10"  (1.473 m)   Wt 150 lb (68 kg)   LMP 10/05/2018 (Exact Date)   SpO2 97%   BMI 31.35 kg/m   Visual Acuity Right Eye Distance:   Left Eye Distance:   Bilateral Distance:    Right Eye Near:   Left Eye Near:    Bilateral Near:     Physical Exam Vitals signs and nursing note reviewed.  Constitutional:      General: She is not in acute distress.    Appearance: Normal appearance. She is not ill-appearing, toxic-appearing or diaphoretic.  HENT:     Head: Normocephalic.     Right Ear: Tympanic membrane and ear  canal normal.     Left Ear: Tympanic membrane and ear canal normal.     Nose: Nose normal.     Mouth/Throat:     Mouth: Mucous membranes are moist.     Pharynx: No oropharyngeal exudate or posterior oropharyngeal erythema.  Eyes:     General:        Right eye: No discharge.        Left eye: No discharge.     Conjunctiva/sclera: Conjunctivae normal.     Pupils: Pupils are equal, round, and reactive to light.  Neck:     Musculoskeletal: Normal range of motion and neck supple.  Pulmonary:     Effort: Pulmonary effort is normal.     Breath sounds: Rales present.     Comments: With bibasilar crackles loudest and more coarse in the left lower field Musculoskeletal: Normal range of motion.  Lymphadenopathy:     Cervical: No cervical adenopathy.  Skin:    General: Skin is warm and dry.  Neurological:     General: No focal deficit present.     Mental Status: She is alert and oriented to person, place, and time.  Psychiatric:        Mood and Affect: Mood normal.        Behavior: Behavior normal.        Thought Content: Thought content normal.        Judgment: Judgment normal.      UC Treatments / Results  Labs (all labs ordered are listed, but only abnormal results are displayed) Labs Reviewed - No data to display  EKG None  Radiology Dg Chest 2 View  Result Date: 10/11/2018 CLINICAL DATA:  Cough for several weeks EXAM: CHEST - 2 VIEW COMPARISON:  None. FINDINGS: There is airspace consolidation in a portion of the lingula. The lungs elsewhere are clear. Heart size and pulmonary vascularity are normal. No adenopathy. No bone lesions. IMPRESSION: Airspace consolidation consistent with pneumonia in a portion of the lingula. Lungs elsewhere clear. No evident adenopathy. These results will be  called to the ordering clinician or representative by the Radiologist Assistant, and communication documented in the PACS or zVision Dashboard. Electronically Signed   By: Bretta BangWilliam  Woodruff III  M.D.   On: 10/11/2018 09:55    Procedures Procedures (including critical care time)  Medications Ordered in UC Medications - No data to display  Initial Impression / Assessment and Plan / UC Course  I have reviewed the triage vital signs and the nursing notes.  Pertinent labs & imaging results that were available during my care of the patient were reviewed by me and considered in my medical decision making (see chart for details).     Reviewed the x-rays with the patient.  She has air space consolidation consistent with pneumonia in a portion of the lingula.  Offered her several treatment options including third-generation cephalosporin and a Z-Pak, or Levaquin.  She states that Levaquin has helped her before.  She is aware of black box warnings.  Despite this  she is willing to accept that risk for taking a single antibiotics.  I have given her a prescription for albuterol because of shortness of breath as well as Tessalon Perles for coughing.  She will rest and drink plenty of fluids.  She has taken Levaquin in the past for her previous pneumonia.  She will follow-up with her primary care physician Dr. Shella Spearinghrismon in 4 weeks for another x-ray for proof of cure. Final Clinical Impressions(s) / UC Diagnoses   Final diagnoses:  Community acquired pneumonia of left lung, unspecified part of lung   Discharge Instructions   None    ED Prescriptions    Medication Sig Dispense Auth. Provider   levofloxacin (LEVAQUIN) 500 MG tablet Take 1 tablet (500 mg total) by mouth daily. 7 tablet Ovid Curdoemer, Jahayra Mazo P, PA-C   Albuterol Sulfate (PROAIR RESPICLICK) 108 (90 Base) MCG/ACT AEPB Inhale 2 puffs into the lungs 4 (four) times daily as needed. Use spacer 1 each Lutricia Feiloemer, Quanisha Drewry P, PA-C   benzonatate (TESSALON) 200 MG capsule Take one cap TID PRN cough 30 capsule Lutricia Feiloemer, Tanner Vigna P, PA-C     Controlled Substance Prescriptions  [place] Controlled Substance Registry consulted? Not Applicable   Lutricia FeilRoemer, Keyante Durio  P, PA-C 10/11/18 1025

## 2020-03-09 IMAGING — CR DG CHEST 2V
2 series · 2 of 2 positions shown · non-contrast
Comparison: None.

CLINICAL DATA: Cough for several weeks

EXAM:
CHEST - 2 VIEW

[chest pa]
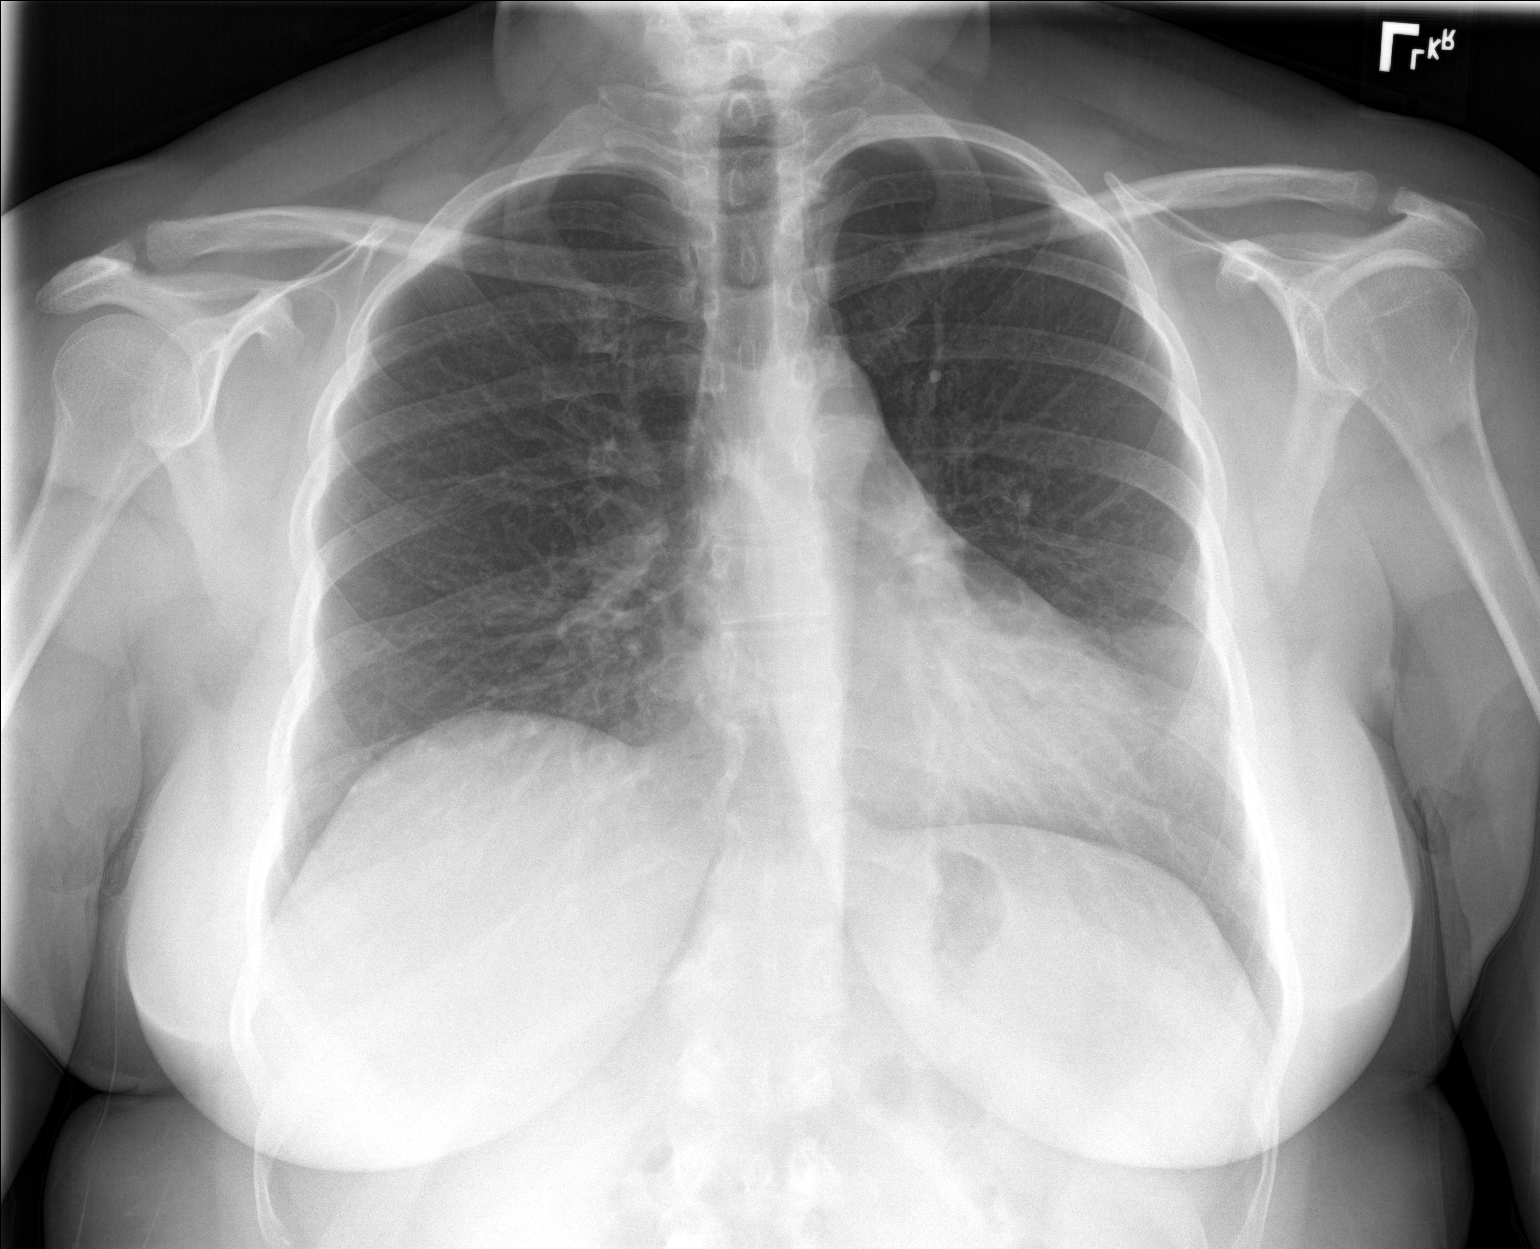

[chest lat]
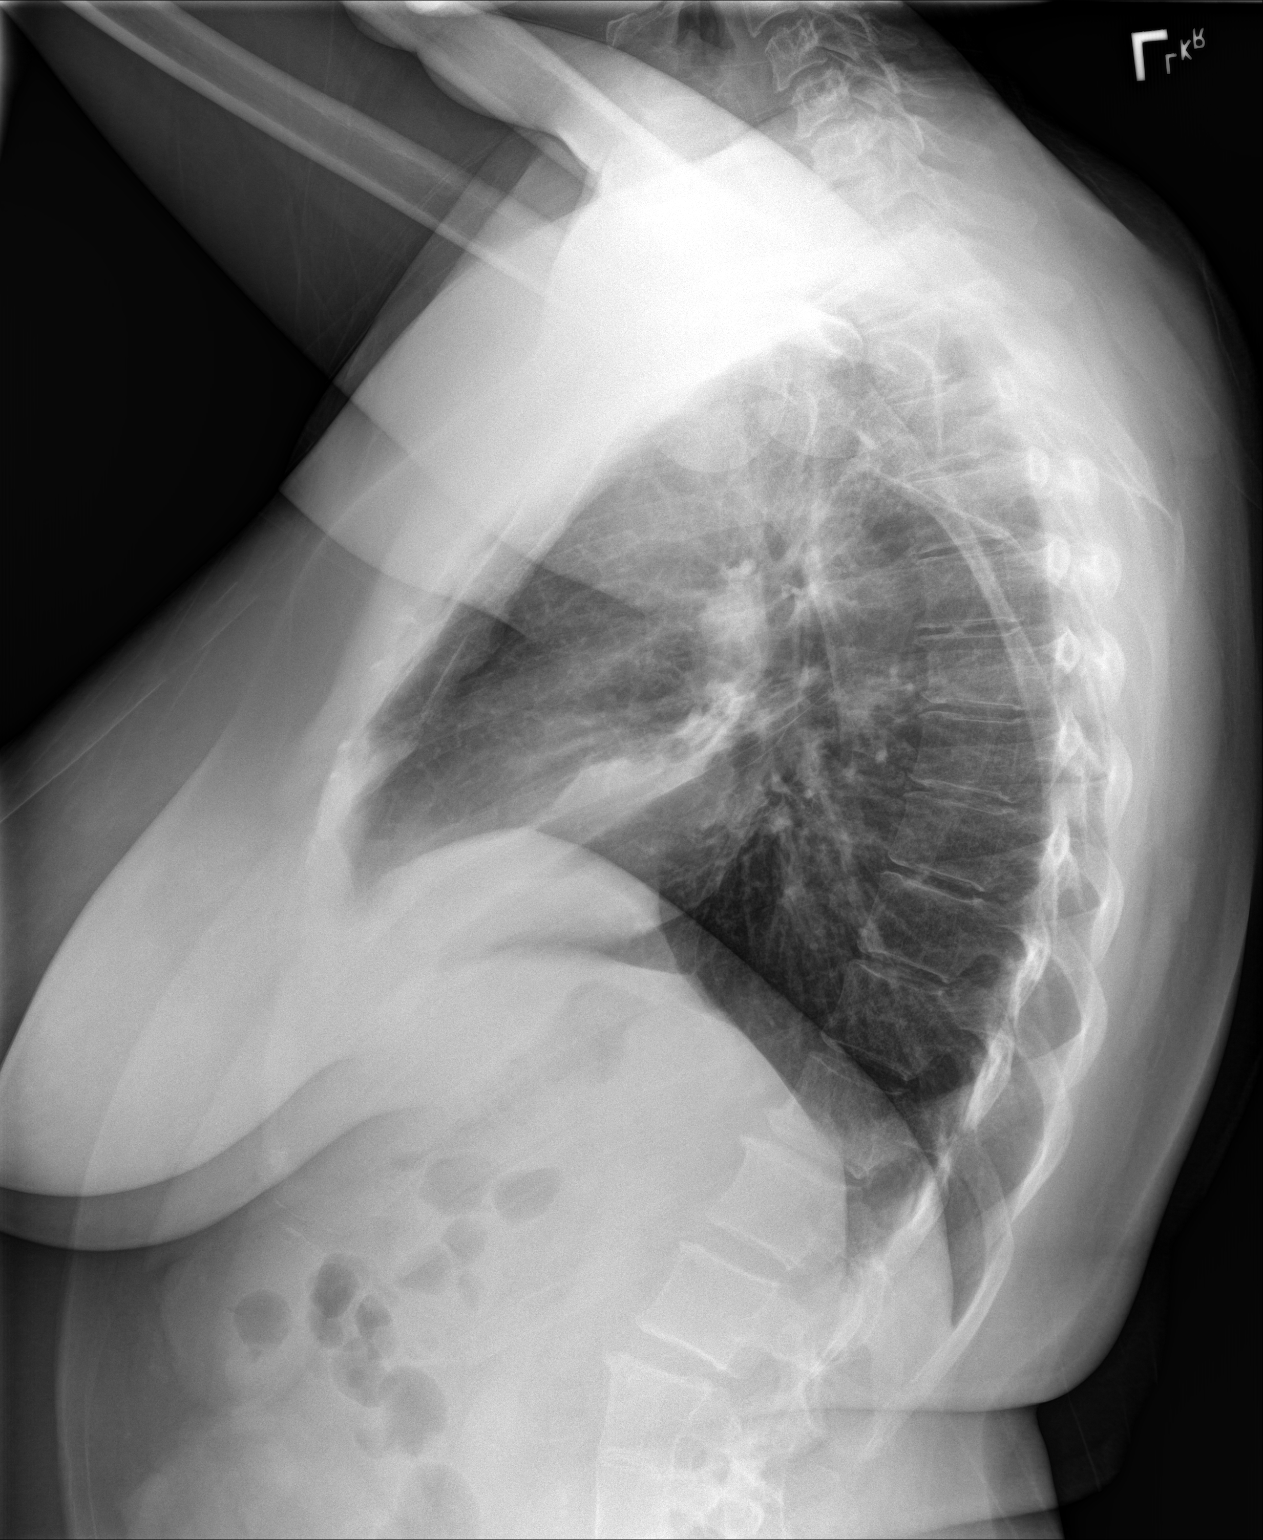

[2 of 2 positions shown; findings below may reference images not displayed]

FINDINGS: There is airspace consolidation in a portion of the lingula. The
lungs elsewhere are clear. Heart size and pulmonary vascularity are
normal. No adenopathy. No bone lesions.
IMPRESSION: Airspace consolidation consistent with pneumonia in a portion of the
lingula. Lungs elsewhere clear. No evident adenopathy.

These results will be called to the ordering clinician or
representative by the Radiologist Assistant, and communication
documented in the PACS or zVision Dashboard.

## 2020-04-07 ENCOUNTER — Other Ambulatory Visit: Payer: Self-pay

## 2020-04-07 ENCOUNTER — Ambulatory Visit: Payer: Self-pay | Attending: Internal Medicine

## 2020-04-07 DIAGNOSIS — Z23 Encounter for immunization: Secondary | ICD-10-CM

## 2020-04-07 NOTE — Progress Notes (Signed)
   Covid-19 Vaccination Clinic  Name:  [name]    MRN: [id number] DOB: November 16, 1970  04/07/2020  Ms. Rottmann was observed post Covid-19 immunization for 15 minutes without incident. She was provided with Vaccine Information Sheet and instruction to access the V-Safe system.   Ms. Kessenich was instructed to call 911 with any severe reactions post vaccine: Marland Kitchen Difficulty breathing  . Swelling of face and throat  . A fast heartbeat  . A bad rash all over body  . Dizziness and weakness   Immunizations Administered    Name Date Dose VIS Date Route   Pfizer COVID-19 Vaccine 04/07/2020 10:14 AM 0.3 mL 12/04/2018 Intramuscular   Manufacturer: [employer], [employer]   Lot: [id number]   NDC: [address]-1

## 2020-04-28 ENCOUNTER — Ambulatory Visit: Payer: Self-pay | Attending: Internal Medicine

## 2020-04-28 DIAGNOSIS — Z23 Encounter for immunization: Secondary | ICD-10-CM

## 2020-04-28 NOTE — Progress Notes (Signed)
   Covid-19 Vaccination Clinic  Name:  Sherry Bass    MRN: [id number] DOB: January 17, 1971  04/28/2020  [name] was observed post Covid-19 immunization for 15 minutes without incident. She was provided with Vaccine Information Sheet and instruction to access the V-Safe system.   [name] was instructed to call 911 with any severe reactions post vaccine: Marland Kitchen Difficulty breathing  . Swelling of face and throat  . A fast heartbeat  . A bad rash all over body  . Dizziness and weakness   Immunizations Administered    Name Date Dose VIS Date Route   Pfizer COVID-19 Vaccine 04/28/2020 10:03 AM 0.3 mL 12/04/2018 Intramuscular   Manufacturer: [employer], [employer]   Lot: [id number]   NDC: [address]-3

## 2021-11-25 ENCOUNTER — Ambulatory Visit: Payer: 59 | Admitting: Nurse Practitioner

## 2021-11-25 ENCOUNTER — Other Ambulatory Visit: Payer: Self-pay

## 2021-11-25 ENCOUNTER — Encounter: Payer: Self-pay | Admitting: Nurse Practitioner

## 2021-11-25 VITALS — BP 179/112 | HR 84 | Temp 98.2°F | Ht 58.7 in | Wt 166.4 lb

## 2021-11-25 DIAGNOSIS — Z7689 Persons encountering health services in other specified circumstances: Secondary | ICD-10-CM | POA: Diagnosis not present

## 2021-11-25 DIAGNOSIS — I10 Essential (primary) hypertension: Secondary | ICD-10-CM

## 2021-11-25 MED ORDER — VALSARTAN-HYDROCHLOROTHIAZIDE 80-12.5 MG PO TABS: 1.0000 | ORAL_TABLET | Freq: Every day | ORAL | 0 refills | Status: DC

## 2021-11-25 NOTE — Progress Notes (Signed)
BP (!) 179/112 (BP Location: Left Arm, Cuff Size: Normal)    Pulse 84    Temp 98.2 F (36.8 C) (Oral)    Ht 4' 10.7" (1.491 m)    Wt 166 lb 6.4 oz (75.5 kg)    LMP 11/02/2021 (Approximate)    SpO2 98%    BMI 33.95 kg/m    Subjective:    Patient ID: Sherry Bass, female    DOB: Jun 23, 1971, 51 y.o.   MRN: [id number]  HPI: Sherry Bass is a 51 y.o. female  Chief Complaint  Patient presents with   New Patient (Initial Visit)        Migraine    Pt states she has been having migraines for a while. States they are a little bit better but still happen.    Hypertension   Patient presents to clinic to establish care with new PCP.  Introduced to Designer, jewellery role and practice setting.  All questions answered.  Discussed provider/patient relationship and expectations.  Patient reports a history of Hypertension- controlled with diet and exercise, migraines.  Patient had two C sections and a D&C.    Patient denies a history of: Hypertension, Elevated Cholesterol, Diabetes, Thyroid problems, Depression, Anxiety, Neurological problems, and Abdominal problems.   HYPERTENSION Hypertension status: controlled  Satisfied with current treatment? no Duration of hypertension: years BP monitoring frequency:  not checking BP range:  BP medication side effects:  no Medication compliance: excellent compliance Previous BP meds: none Aspirin: no Recurrent headaches: yes Visual changes: no Palpitations: no Dyspnea: no Chest pain: no Lower extremity edema: no Dizzy/lightheaded: no    Active Ambulatory Problems    Diagnosis Date Noted   Hypertension 11/25/2021   Resolved Ambulatory Problems    Diagnosis Date Noted   No Resolved Ambulatory Problems   Past Medical History:  Diagnosis Date   Achilles tendonitis    Allergic rhinitis    Past Surgical History:  Procedure Laterality Date   CESAREAN SECTION     x 2   Family History  Problem Relation Age of Onset    Diabetes Mother    Hypertension Mother    Congestive Heart Failure Mother    Hyperlipidemia Father    Stroke Sister    Stroke Maternal Grandfather    Dementia Paternal Grandmother    Heart disease Paternal Grandfather      Review of Systems  Eyes:  Negative for visual disturbance.  Respiratory:  Negative for cough, chest tightness and shortness of breath.   Cardiovascular:  Negative for chest pain, palpitations and leg swelling.  Neurological:  Negative for dizziness and headaches.   Per HPI unless specifically indicated above     Objective:    BP (!) 179/112 (BP Location: Left Arm, Cuff Size: Normal)    Pulse 84    Temp 98.2 F (36.8 C) (Oral)    Ht 4' 10.7" (1.491 m)    Wt 166 lb 6.4 oz (75.5 kg)    LMP 11/02/2021 (Approximate)    SpO2 98%    BMI 33.95 kg/m   Wt Readings from Last 3 Encounters:  11/25/21 166 lb 6.4 oz (75.5 kg)  10/11/18 150 lb (68 kg)  10/02/18 152 lb (68.9 kg)    Physical Exam Vitals and nursing note reviewed.  Constitutional:      General: She is not in acute distress.    Appearance: Normal appearance. She is normal weight. She is not ill-appearing, toxic-appearing or diaphoretic.  HENT:     Head:  Normocephalic.     Right Ear: External ear normal.     Left Ear: External ear normal.     Nose: Nose normal.     Mouth/Throat:     Mouth: Mucous membranes are moist.     Pharynx: Oropharynx is clear.  Eyes:     General:        Right eye: No discharge.        Left eye: No discharge.     Extraocular Movements: Extraocular movements intact.     Conjunctiva/sclera: Conjunctivae normal.     Pupils: Pupils are equal, round, and reactive to light.  Cardiovascular:     Rate and Rhythm: Normal rate and regular rhythm.     Heart sounds: No murmur heard. Pulmonary:     Effort: Pulmonary effort is normal. No respiratory distress.     Breath sounds: Normal breath sounds. No wheezing or rales.  Musculoskeletal:     Cervical back: Normal range of motion and  neck supple.  Skin:    General: Skin is warm and dry.     Capillary Refill: Capillary refill takes less than 2 seconds.  Neurological:     General: No focal deficit present.     Mental Status: She is alert and oriented to person, place, and time. Mental status is at baseline.  Psychiatric:        Mood and Affect: Mood normal.        Behavior: Behavior normal.        Thought Content: Thought content normal.        Judgment: Judgment normal.    Results for orders placed or performed during the hospital encounter of 10/02/18  Rapid Strep Screen ([facility] ONLY)   Specimen: Throat; Other  Result Value Ref Range   Streptococcus, Group A Screen (Direct) POSITIVE (A) NEGATIVE      Assessment & Plan:   Problem List Items Addressed This Visit       Cardiovascular and Mediastinum   Hypertension    Chronic. Has a history of hypertension that was controlled with diet and exercise.  Has not been checking at home but has been having headaches.  Will start Valsartan 80mg  with HCTZ 12.5mg  daily.  Side effects and benefits of medication discussed with patient during visit. Follow up in 1 month for reevaluation.      Relevant Medications   valsartan-hydrochlorothiazide (DIOVAN-HCT) 80-12.5 MG tablet   Other Visit Diagnoses     Encounter to establish care    -  Primary        Follow up plan: Return in about 1 month (around 12/23/2021) for Physical and Fasting labs, BP Check.   A total of 40 minutes were spent on this encounter today.  When total time is documented, this includes both the face-to-face and non-face-to-face time personally spent before, during and after the visit on the date of the encounter discussing headaches, high blood pressure and medical history.

## 2021-11-25 NOTE — Assessment & Plan Note (Signed)
Chronic. Has a history of hypertension that was controlled with diet and exercise.  Has not been checking at home but has been having headaches.  Will start Valsartan 80mg  with HCTZ 12.5mg  daily.  Side effects and benefits of medication discussed with patient during visit. Follow up in 1 month for reevaluation.

## 2021-12-13 ENCOUNTER — Ambulatory Visit: Payer: Self-pay | Admitting: *Deleted

## 2021-12-13 NOTE — Telephone Encounter (Signed)
?  Chief Complaint: Bilateral lower back pain and stomach pain after taking Diovan-HCT since she started taking it.  Takes it at noon and pain starts soon after that. ?Symptoms: "I feel like I have a band around my middle, lower back pain over kidneys and stomach pain. ?Frequency: Daily after taking the Diovan-HCT at noon. ?Pertinent Negatives: Patient denies No N/V/D or changes in urinary symptoms ?Disposition: [] ED /[] Urgent Care (no appt availability in office) / [] Appointment(In office/virtual)/ []  [facility]/ [] Home Care/ [] Refused Recommended Disposition /[] [place] [facility]/ [x]  Follow-up with PCP ?Additional Notes: I have sent a message to Jon Billings, NP and someone will be calling pt back.   Pt was agreeable to this plan.  ?

## 2021-12-13 NOTE — Telephone Encounter (Signed)
Patient was notified and made aware of Karen's recommendations. Patient verbalized understanding and has no further questions at this time.  ?

## 2021-12-13 NOTE — Telephone Encounter (Signed)
I returned pt's call.   She is c/o stomach and back discomfort for roughly 5 days.   It's lower back pain.  She thinks it's the Diovan-HCT 80-12.5 mg tablets.   Denies N/V/D. ? ? ?Reason for Disposition ? [1] Caller has URGENT medicine question about med that PCP or specialist prescribed AND [2] triager unable to answer question ?   Diovan-HCT causing lower back pain on both sides and stomach pain since starting it. ? ?Answer Assessment - Initial Assessment Questions ?1. NAME of MEDICATION: "What medicine are you calling about?" ?    Diovan-HCT 80-12.5 mg tablets. ?2. QUESTION: "What is your question?" (e.g., double dose of medicine, side effect) ?    In last 3-4 days I'm fine until I take the pill at noon and my lower back and stomach start hurting.   These symptoms started when I started the Diovan.    It feels like a band around my middle.   My lower back is hurting on both sides over my kidneys and my stomach hurts.   I'm having trouble sleeping due to the pain and discomfort. ?Denies urinary frequency, burning, change in odor or appearance of urine.  ?3. PRESCRIBING HCP: "Who prescribed it?" Reason: if prescribed by specialist, call should be referred to that group. ?    Jon Billings, NP ?4. SYMPTOMS: "Do you have any symptoms?" ?    Lower back pain on both sides over my kidneys and stomach pain.   It's like I have a band around my middle after taking the Diovan-HCT.   This started when I started the Diovan-HCT ?5. SEVERITY: If symptoms are present, ask "Are they mild, moderate or severe?" ?    Severe  Keeping me up at night ?6. PREGNANCY:  "Is there any chance that you are pregnant?" "When was your last menstrual period?" ?    Not asked ? ?Protocols used: Medication Question Call-A-AH ? ?

## 2021-12-14 ENCOUNTER — Other Ambulatory Visit: Payer: Self-pay

## 2021-12-14 ENCOUNTER — Encounter: Payer: Self-pay | Admitting: Nurse Practitioner

## 2021-12-14 ENCOUNTER — Ambulatory Visit: Payer: 59 | Admitting: Nurse Practitioner

## 2021-12-14 VITALS — BP 152/83 | HR 85 | Temp 98.0°F | Wt 164.6 lb

## 2021-12-14 DIAGNOSIS — M545 Low back pain, unspecified: Secondary | ICD-10-CM | POA: Diagnosis not present

## 2021-12-14 DIAGNOSIS — Z1211 Encounter for screening for malignant neoplasm of colon: Secondary | ICD-10-CM

## 2021-12-14 DIAGNOSIS — R8290 Unspecified abnormal findings in urine: Secondary | ICD-10-CM | POA: Diagnosis not present

## 2021-12-14 DIAGNOSIS — I10 Essential (primary) hypertension: Secondary | ICD-10-CM

## 2021-12-14 LAB — URINALYSIS, ROUTINE W REFLEX MICROSCOPIC
Bilirubin, UA: NEGATIVE
Glucose, UA: NEGATIVE
Ketones, UA: NEGATIVE
Leukocytes,UA: NEGATIVE
Nitrite, UA: NEGATIVE
RBC, UA: NEGATIVE
Specific Gravity, UA: 1.015 (ref 1.005–1.030)
Urobilinogen, Ur: 0.2 mg/dL (ref 0.2–1.0)
pH, UA: 6.5 (ref 5.0–7.5)

## 2021-12-14 LAB — MICROSCOPIC EXAMINATION: RBC, Urine: NONE SEEN /hpf (ref 0–2)

## 2021-12-14 MED ORDER — LISINOPRIL 20 MG PO TABS: 20.0000 mg | ORAL_TABLET | Freq: Every day | ORAL | 0 refills | Status: DC

## 2021-12-14 MED ORDER — NA SULFATE-K SULFATE-MG SULF 17.5-3.13-1.6 GM/177ML PO SOLN: 1.0000 | Freq: Once | ORAL | 0 refills | Status: AC

## 2021-12-14 NOTE — Assessment & Plan Note (Signed)
Chronic.  Not well controlled.  Patient was having adverse effects to valsartan/ HCTZ.  States she was having a band like pressure and decreased urine that start 3-4 days ago after taking the medication.  Will change medication to Lisinopril 20mg .  Labs ordered today.  Return to clinic in 2 weeks for reevaluation.  Call sooner if concerns arise.  ? ?

## 2021-12-14 NOTE — Progress Notes (Signed)
? ?BP (!) 152/83 (BP Location: Left Arm, Cuff Size: Normal)   Pulse 85   Temp 98 ?F (36.7 ?C) (Oral)   Wt 164 lb 9.6 oz (74.7 kg)   SpO2 98%   BMI 33.59 kg/m?   ? ?Subjective:  ? ? Patient ID: Sherry Bass, female    DOB: May 29, 1971, 51 y.o.   MRN: [id number] ? ?HPI: ?Sherry Bass is a 51 y.o. female ? ?Chief Complaint  ?Patient presents with  ? Hypertension  ? ?HYPERTENSION ?Patient states 3-4 days ago after she took the diovan with HCTZ she started having pressure in her stomach and her back.  States it was "like a band going around her" that was causing a deep pressure.  States she was okay in the morning then by the end of the day the pressure continued to worsen and caused her agony. Patient did not take her pill yesterday and the symptoms have resolved.  She hasn't taken the pill today and doesn't have any of the symptoms.  Denies dysuria and abdominal pain. ? ?Relevant past medical, surgical, family and social history reviewed and updated as indicated. Interim medical history since our last visit reviewed. ?Allergies and medications reviewed and updated. ? ?Review of Systems  ?Genitourinary:  Negative for decreased urine volume and dysuria.  ?Musculoskeletal:  Positive for back pain.  ? ?Per HPI unless specifically indicated above ? ?   ?Objective:  ?  ?BP (!) 152/83 (BP Location: Left Arm, Cuff Size: Normal)   Pulse 85   Temp 98 ?F (36.7 ?C) (Oral)   Wt 164 lb 9.6 oz (74.7 kg)   SpO2 98%   BMI 33.59 kg/m?   ?Wt Readings from Last 3 Encounters:  ?12/14/21 164 lb 9.6 oz (74.7 kg)  ?11/25/21 166 lb 6.4 oz (75.5 kg)  ?10/11/18 150 lb (68 kg)  ?  ?Physical Exam ?Vitals and nursing note reviewed.  ?Constitutional:   ?   General: She is not in acute distress. ?   Appearance: Normal appearance. She is normal weight. She is not ill-appearing, toxic-appearing or diaphoretic.  ?HENT:  ?   Head: Normocephalic.  ?   Right Ear: External ear normal.  ?   Left Ear: External ear normal.  ?   Nose:  Nose normal.  ?   Mouth/Throat:  ?   Mouth: Mucous membranes are moist.  ?   Pharynx: Oropharynx is clear.  ?Eyes:  ?   General:     ?   Right eye: No discharge.     ?   Left eye: No discharge.  ?   Extraocular Movements: Extraocular movements intact.  ?   Conjunctiva/sclera: Conjunctivae normal.  ?   Pupils: Pupils are equal, round, and reactive to light.  ?Cardiovascular:  ?   Rate and Rhythm: Normal rate and regular rhythm.  ?   Heart sounds: No murmur heard. ?Pulmonary:  ?   Effort: Pulmonary effort is normal. No respiratory distress.  ?   Breath sounds: Normal breath sounds. No wheezing or rales.  ?Abdominal:  ?   General: Abdomen is flat. Bowel sounds are normal. There is no distension.  ?   Palpations: Abdomen is soft.  ?   Tenderness: There is no abdominal tenderness. There is no right CVA tenderness, left CVA tenderness or guarding.  ?Musculoskeletal:  ?   Cervical back: Normal range of motion and neck supple.  ?Skin: ?   General: Skin is warm and dry.  ?   Capillary Refill:  Capillary refill takes less than 2 seconds.  ?Neurological:  ?   General: No focal deficit present.  ?   Mental Status: She is alert and oriented to person, place, and time. Mental status is at baseline.  ?Psychiatric:     ?   Mood and Affect: Mood normal.     ?   Behavior: Behavior normal.     ?   Thought Content: Thought content normal.     ?   Judgment: Judgment normal.  ? ? ?Results for orders placed or performed during the hospital encounter of 10/02/18  ?Rapid Strep Screen ([facility] ONLY)  ? Specimen: Throat; Other  ?Result Value Ref Range  ? Streptococcus, Group A Screen (Direct) POSITIVE (A) NEGATIVE  ? ?   ?Assessment & Plan:  ? ?Problem List Items Addressed This Visit   ? ?  ? Cardiovascular and Mediastinum  ? Hypertension - Primary  ?  Chronic.  Not well controlled.  Patient was having adverse effects to valsartan/ HCTZ.  States she was having a band like pressure and decreased urine that start 3-4 days ago after taking  the medication.  Will change medication to Lisinopril 16m.  Labs ordered today.  Return to clinic in 2 weeks for reevaluation.  Call sooner if concerns arise.  ? ?  ?  ? Relevant Medications  ? lisinopril (ZESTRIL) 20 MG tablet  ? Other Relevant Orders  ? Comp Met (CMET)  ? ?Other Visit Diagnoses   ? ? Colon cancer screening      ? Relevant Orders  ? Ambulatory referral to Gastroenterology  ? Comp Met (CMET)  ? Acute bilateral low back pain, unspecified whether sciatica present      ? Relevant Orders  ? Urinalysis, Routine w reflex microscopic  ? Comp Met (CMET)  ? ?  ?  ? ?Follow up plan: ?Return for Has follow up schedule. ? ? ? ? ? ?

## 2021-12-14 NOTE — Progress Notes (Signed)
Gastroenterology Pre-Procedure Review ? ?Request Date: 01/19/2022 ?Requesting Physician: Dr. Marius Ditch ? ?PATIENT REVIEW QUESTIONS: The patient responded to the following health history questions as indicated:   ? ?1. Are you having any GI issues? no ?2. Do you have a personal history of Polyps? no ?3. Do you have a family history of Colon Cancer or Polyps? no ?4. Diabetes Mellitus? no ?5. Joint replacements in the past 12 months?no ?6. Major health problems in the past 3 months?no ?7. Any artificial heart valves, MVP, or defibrillator?no ?   ?MEDICATIONS & ALLERGIES:    ?Patient reports the following regarding taking any anticoagulation/antiplatelet therapy:   ?Plavix, Coumadin, Eliquis, Xarelto, Lovenox, Pradaxa, Brilinta, or Effient? no ?Aspirin? no ? ?Patient confirms/reports the following medications:  ?Current Outpatient Medications  ?Medication Sig Dispense Refill  ? lisinopril (ZESTRIL) 20 MG tablet Take 1 tablet (20 mg total) by mouth daily. 30 tablet 0  ? ?No current facility-administered medications for this visit.  ? ? ?Patient confirms/reports the following allergies:  ?Allergies  ?Allergen Reactions  ? Prednisone Nausea And Vomiting  ? ? ?No orders of the defined types were placed in this encounter. ? ? ?AUTHORIZATION INFORMATION ?Primary Insurance: ?1D#: ?Group #: ? ?Secondary Insurance: ?1D#: ?Group #: ? ?SCHEDULE INFORMATION: ?Date: 01/19/2022 ?Time: ?Location: [facility] ? ?

## 2021-12-14 NOTE — Addendum Note (Signed)
Addended by: Larae Grooms on: 12/14/2021 11:54 AM ? ? Modules accepted: Orders ? ?

## 2021-12-15 LAB — COMPREHENSIVE METABOLIC PANEL
ALT: 46 IU/L — ABNORMAL HIGH (ref 0–32)
AST: 31 IU/L (ref 0–40)
Albumin/Globulin Ratio: 1.8 (ref 1.2–2.2)
Albumin: 4.8 g/dL (ref 3.8–4.9)
Alkaline Phosphatase: 124 IU/L — ABNORMAL HIGH (ref 44–121)
BUN/Creatinine Ratio: 14 (ref 9–23)
BUN: 14 mg/dL (ref 6–24)
Bilirubin Total: 0.4 mg/dL (ref 0.0–1.2)
CO2: 20 mmol/L (ref 20–29)
Calcium: 9.4 mg/dL (ref 8.7–10.2)
Chloride: 106 mmol/L (ref 96–106)
Creatinine, Ser: 1 mg/dL (ref 0.57–1.00)
Globulin, Total: 2.7 g/dL (ref 1.5–4.5)
Glucose: 93 mg/dL (ref 70–99)
Potassium: 3.9 mmol/L (ref 3.5–5.2)
Sodium: 145 mmol/L — ABNORMAL HIGH (ref 134–144)
Total Protein: 7.5 g/dL (ref 6.0–8.5)
eGFR: 68 mL/min/{1.73_m2} (ref >59)

## 2021-12-15 NOTE — Progress Notes (Signed)
Please let patient know that her lab work is relatively unremarkable to explain her symptoms.  Her liver enzymes were very mildly elevated and we will repeat this at her next visit.  Her urine did show some bacteria but not a lot.  I have sent it for culture to see if it requires antibiotics.  We will follow up as our next visit.

## 2021-12-16 LAB — URINE CULTURE

## 2021-12-16 NOTE — Progress Notes (Signed)
Please let patient know that her urine culture did not grow any bacteria.  There is no need for further treatment.

## 2021-12-23 ENCOUNTER — Ambulatory Visit
Admission: RE | Admit: 2021-12-23 | Discharge: 2021-12-23 | Disposition: A | Discharge status: Home / Self Care | Payer: 59 | Attending: Nurse Practitioner | Admitting: Nurse Practitioner

## 2021-12-23 ENCOUNTER — Ambulatory Visit: Payer: 59 | Admitting: Nurse Practitioner

## 2021-12-23 ENCOUNTER — Encounter: Payer: Self-pay | Admitting: Nurse Practitioner

## 2021-12-23 ENCOUNTER — Other Ambulatory Visit: Payer: Self-pay

## 2021-12-23 ENCOUNTER — Ambulatory Visit
Admission: RE | Admit: 2021-12-23 | Discharge: 2021-12-23 | Disposition: A | Discharge status: Home / Self Care | Payer: 59 | Source: Ambulatory Visit | Attending: Nurse Practitioner | Admitting: Nurse Practitioner

## 2021-12-23 VITALS — BP 178/90 | HR 76 | Temp 97.8°F | Wt 165.6 lb

## 2021-12-23 DIAGNOSIS — M79671 Pain in right foot: Secondary | ICD-10-CM | POA: Insufficient documentation

## 2021-12-23 DIAGNOSIS — I10 Essential (primary) hypertension: Secondary | ICD-10-CM | POA: Diagnosis not present

## 2021-12-23 NOTE — Assessment & Plan Note (Signed)
Chronic. Not well controlled.  Patient is tolerating medication well.  She has only been on it for 1 week.  Will follow up with patient in 3 weeks for reevaluation.  Will increase Lisinopril to 40mg  at that time if necessary.   ?

## 2021-12-23 NOTE — Progress Notes (Signed)
? ?BP (!) 178/90   Pulse 76   Temp 97.8 ?F (36.6 ?C) (Oral)   Wt 165 lb 9.6 oz (75.1 kg)   SpO2 98%   BMI 33.79 kg/m?   ? ?Subjective:  ? ? Patient ID: Sherry Bass, female    DOB: 1971-01-02, 51 y.o.   MRN: [id number] ? ?HPI: ?Sherry Bass is a 51 y.o. female ? ?Chief Complaint  ?Patient presents with  ? Hypertension  ? ?HYPERTENSION ?Patient states she is tolerating the Lisinopril well.  Has not had any side effects that she is aware of.  She is doing well with the medication.  Blood pressure was 130-140/90-100.   Denies HA, CP, SOB, dizziness, palpitations, visual changes, and lower extremity swelling. ? ?Patient states she hurt her foot about 2 years ago while she was exercising.  Pain is persistent and she is swelling on the top of her foot.  She is able to exercise and do daily activities but concerned about the swelling. ? ? ?Relevant past medical, surgical, family and social history reviewed and updated as indicated. Interim medical history since our last visit reviewed. ?Allergies and medications reviewed and updated. ? ?Review of Systems  ?Eyes:  Negative for visual disturbance.  ?Respiratory:  Negative for cough, chest tightness and shortness of breath.   ?Cardiovascular:  Negative for chest pain, palpitations and leg swelling.  ?Musculoskeletal:   ?     Right foot pain and swelling  ?Neurological:  Negative for dizziness and headaches.  ? ?Per HPI unless specifically indicated above ? ?   ?Objective:  ?  ?BP (!) 178/90   Pulse 76   Temp 97.8 ?F (36.6 ?C) (Oral)   Wt 165 lb 9.6 oz (75.1 kg)   SpO2 98%   BMI 33.79 kg/m?   ?Wt Readings from Last 3 Encounters:  ?12/23/21 165 lb 9.6 oz (75.1 kg)  ?12/14/21 164 lb 9.6 oz (74.7 kg)  ?11/25/21 166 lb 6.4 oz (75.5 kg)  ?  ?Physical Exam ?Vitals and nursing note reviewed.  ?Constitutional:   ?   General: She is not in acute distress. ?   Appearance: Normal appearance. She is normal weight. She is not ill-appearing, toxic-appearing or  diaphoretic.  ?HENT:  ?   Head: Normocephalic.  ?   Right Ear: External ear normal.  ?   Left Ear: External ear normal.  ?   Nose: Nose normal.  ?   Mouth/Throat:  ?   Mouth: Mucous membranes are moist.  ?   Pharynx: Oropharynx is clear.  ?Eyes:  ?   General:     ?   Right eye: No discharge.     ?   Left eye: No discharge.  ?   Extraocular Movements: Extraocular movements intact.  ?   Conjunctiva/sclera: Conjunctivae normal.  ?   Pupils: Pupils are equal, round, and reactive to light.  ?Cardiovascular:  ?   Rate and Rhythm: Normal rate and regular rhythm.  ?   Heart sounds: No murmur heard. ?Pulmonary:  ?   Effort: Pulmonary effort is normal. No respiratory distress.  ?   Breath sounds: Normal breath sounds. No wheezing or rales.  ?Abdominal:  ?   General: Abdomen is flat. Bowel sounds are normal. There is no distension.  ?   Palpations: Abdomen is soft.  ?   Tenderness: There is no abdominal tenderness. There is no right CVA tenderness, left CVA tenderness or guarding.  ?Musculoskeletal:  ?   Cervical back: Normal range  of motion and neck supple.  ?   Right foot: Normal range of motion. Swelling and bony tenderness (over 4th metatarsal) present. No deformity.  ?Skin: ?   General: Skin is warm and dry.  ?   Capillary Refill: Capillary refill takes less than 2 seconds.  ?Neurological:  ?   General: No focal deficit present.  ?   Mental Status: She is alert and oriented to person, place, and time. Mental status is at baseline.  ?Psychiatric:     ?   Mood and Affect: Mood normal.     ?   Behavior: Behavior normal.     ?   Thought Content: Thought content normal.     ?   Judgment: Judgment normal.  ? ? ?Results for orders placed or performed in visit on 12/14/21  ?Microscopic Examination  ? Urine  ?Result Value Ref Range  ? WBC, UA 0-5 0 - 5 /hpf  ? RBC None seen 0 - 2 /hpf  ? Epithelial Cells (non renal) 0-10 0 - 10 /hpf  ? Bacteria, UA Few (A) None seen/Few  ?Urine Culture  ? Specimen: Urine  ? UR  ?Result Value Ref  Range  ? Urine Culture, Routine Final report   ? Organism ID, Bacteria Comment   ?Urinalysis, Routine w reflex microscopic  ?Result Value Ref Range  ? Specific Gravity, UA 1.015 1.005 - 1.030  ? pH, UA 6.5 5.0 - 7.5  ? Color, UA Yellow Yellow  ? Appearance Ur Clear Clear  ? Leukocytes,UA Negative Negative  ? Protein,UA Trace (A) Negative/Trace  ? Glucose, UA Negative Negative  ? Ketones, UA Negative Negative  ? RBC, UA Negative Negative  ? Bilirubin, UA Negative Negative  ? Urobilinogen, Ur 0.2 0.2 - 1.0 mg/dL  ? Nitrite, UA Negative Negative  ? Microscopic Examination See below:   ?Comp Met (CMET)  ?Result Value Ref Range  ? Glucose 93 70 - 99 mg/dL  ? BUN 14 6 - 24 mg/dL  ? Creatinine, Ser 1.00 0.57 - 1.00 mg/dL  ? eGFR 68 >59 mL/min/1.73  ? BUN/Creatinine Ratio 14 9 - 23  ? Sodium 145 (H) 134 - 144 mmol/L  ? Potassium 3.9 3.5 - 5.2 mmol/L  ? Chloride 106 96 - 106 mmol/L  ? CO2 20 20 - 29 mmol/L  ? Calcium 9.4 8.7 - 10.2 mg/dL  ? Total Protein 7.5 6.0 - 8.5 g/dL  ? Albumin 4.8 3.8 - 4.9 g/dL  ? Globulin, Total 2.7 1.5 - 4.5 g/dL  ? Albumin/Globulin Ratio 1.8 1.2 - 2.2  ? Bilirubin Total 0.4 0.0 - 1.2 mg/dL  ? Alkaline Phosphatase 124 (H) 44 - 121 IU/L  ? AST 31 0 - 40 IU/L  ? ALT 46 (H) 0 - 32 IU/L  ? ?   ?Assessment & Plan:  ? ?Problem List Items Addressed This Visit   ? ?  ? Cardiovascular and Mediastinum  ? Hypertension - Primary  ?  Chronic. Not well controlled.  Patient is tolerating medication well.  She has only been on it for 1 week.  Will follow up with patient in 3 weeks for reevaluation.  Will increase Lisinopril to 60m at that time if necessary.   ?  ?  ? ?Other Visit Diagnoses   ? ? Right foot pain      ? Will obtain xray. Recommend elevating foot when swollen. Will make further recommendaitons based on imaging results.   ? Relevant Orders  ? DG Foot Complete Right  ? ?  ?  ? ?  Follow up plan: ?Return in about 3 weeks (around 01/13/2022) for BP Check. ? ? ? ? ? ?

## 2021-12-27 ENCOUNTER — Telehealth: Payer: Self-pay | Admitting: Nurse Practitioner

## 2021-12-27 NOTE — Telephone Encounter (Signed)
Spoke with patient and made her aware of Karen's recommendations. Patient verbalized understanding. Patient verbalized understanding and has no further questions at this time.  ?

## 2021-12-27 NOTE — Progress Notes (Signed)
Please let patient know her xray does not show any evidence of fracture.  There is swelling noted as well as a bunion at the great toe.  I recommend she ortho for further management.  If she has any questions, please let me know.

## 2021-12-27 NOTE — Telephone Encounter (Signed)
It looks as imaging is back, but haven't been resulted by provider. Please advise? ?

## 2021-12-27 NOTE — Progress Notes (Signed)
Referral placed for podiatry

## 2021-12-27 NOTE — Telephone Encounter (Signed)
Copied from CRM [id number]. Topic: General - Other ?>> Dec 24, 2021  4:39 PM Gaetana Michaelis A wrote: ?Reason for CRM: The patient would like to be contacted by a member of clinical staff about their recent imaging when results become available  ? ?Please contact further when possible ?

## 2021-12-27 NOTE — Telephone Encounter (Signed)
Please let patient know that I will send her a message once I am able to review her results.   ?

## 2021-12-27 NOTE — Addendum Note (Signed)
Addended by: Larae Grooms on: 12/27/2021 02:57 PM ? ? Modules accepted: Orders ? ?

## 2022-01-12 NOTE — Progress Notes (Signed)
? ?BP (!) 161/94 (BP Location: Left Arm)   Pulse 76   Temp 97.7 ?F (36.5 ?C) (Oral)   Wt 164 lb 3.2 oz (74.5 kg)   LMP 12/30/2021 (Approximate)   SpO2 98%   BMI 33.50 kg/m?   ? ?Subjective:  ? ? Patient ID: Sherry Bass, female    DOB: April 28, 1971, 51 y.o.   MRN: [id number] ? ?HPI: ?Maayan Jenning is a 52 y.o. female ? ?Chief Complaint  ?Patient presents with  ? Hypertension  ?  3 week follow up  ?Pt states she has colonoscopy scheduled.   ? ?HYPERTENSION ?Patient states she is tolerating the Lisinopril well.  Still not having any side effects from the medication.   She is doing well with the medication.  Blood pressure was 140/100.   Denies HA, CP, SOB, dizziness, palpitations, visual changes, and lower extremity swelling. ? ? ? ?Relevant past medical, surgical, family and social history reviewed and updated as indicated. Interim medical history since our last visit reviewed. ?Allergies and medications reviewed and updated. ? ?Review of Systems  ?Eyes:  Negative for visual disturbance.  ?Respiratory:  Negative for cough, chest tightness and shortness of breath.   ?Cardiovascular:  Negative for chest pain, palpitations and leg swelling.  ?Musculoskeletal:   ?     Right foot pain and swelling  ?Neurological:  Negative for dizziness and headaches.  ? ?Per HPI unless specifically indicated above ? ?   ?Objective:  ?  ?BP (!) 161/94 (BP Location: Left Arm)   Pulse 76   Temp 97.7 ?F (36.5 ?C) (Oral)   Wt 164 lb 3.2 oz (74.5 kg)   LMP 12/30/2021 (Approximate)   SpO2 98%   BMI 33.50 kg/m?   ?Wt Readings from Last 3 Encounters:  ?01/13/22 164 lb 3.2 oz (74.5 kg)  ?12/23/21 165 lb 9.6 oz (75.1 kg)  ?12/14/21 164 lb 9.6 oz (74.7 kg)  ?  ?Physical Exam ?Vitals and nursing note reviewed.  ?Constitutional:   ?   General: She is not in acute distress. ?   Appearance: Normal appearance. She is normal weight. She is not ill-appearing, toxic-appearing or diaphoretic.  ?HENT:  ?   Head: Normocephalic.  ?    Right Ear: External ear normal.  ?   Left Ear: External ear normal.  ?   Nose: Nose normal.  ?   Mouth/Throat:  ?   Mouth: Mucous membranes are moist.  ?   Pharynx: Oropharynx is clear.  ?Eyes:  ?   General:     ?   Right eye: No discharge.     ?   Left eye: No discharge.  ?   Extraocular Movements: Extraocular movements intact.  ?   Conjunctiva/sclera: Conjunctivae normal.  ?   Pupils: Pupils are equal, round, and reactive to light.  ?Cardiovascular:  ?   Rate and Rhythm: Normal rate and regular rhythm.  ?   Heart sounds: No murmur heard. ?Pulmonary:  ?   Effort: Pulmonary effort is normal. No respiratory distress.  ?   Breath sounds: Normal breath sounds. No wheezing or rales.  ?Abdominal:  ?   General: Abdomen is flat. Bowel sounds are normal. There is no distension.  ?   Palpations: Abdomen is soft.  ?   Tenderness: There is no abdominal tenderness. There is no right CVA tenderness, left CVA tenderness or guarding.  ?Musculoskeletal:  ?   Cervical back: Normal range of motion and neck supple.  ?   Right foot:  Normal range of motion. Swelling and bony tenderness (over 4th metatarsal) present. No deformity.  ?Skin: ?   General: Skin is warm and dry.  ?   Capillary Refill: Capillary refill takes less than 2 seconds.  ?Neurological:  ?   General: No focal deficit present.  ?   Mental Status: She is alert and oriented to person, place, and time. Mental status is at baseline.  ?Psychiatric:     ?   Mood and Affect: Mood normal.     ?   Behavior: Behavior normal.     ?   Thought Content: Thought content normal.     ?   Judgment: Judgment normal.  ? ? ?Results for orders placed or performed in visit on 12/14/21  ?Microscopic Examination  ? Urine  ?Result Value Ref Range  ? WBC, UA 0-5 0 - 5 /hpf  ? RBC None seen 0 - 2 /hpf  ? Epithelial Cells (non renal) 0-10 0 - 10 /hpf  ? Bacteria, UA Few (A) None seen/Few  ?Urine Culture  ? Specimen: Urine  ? UR  ?Result Value Ref Range  ? Urine Culture, Routine Final report   ?  Organism ID, Bacteria Comment   ?Urinalysis, Routine w reflex microscopic  ?Result Value Ref Range  ? Specific Gravity, UA 1.015 1.005 - 1.030  ? pH, UA 6.5 5.0 - 7.5  ? Color, UA Yellow Yellow  ? Appearance Ur Clear Clear  ? Leukocytes,UA Negative Negative  ? Protein,UA Trace (A) Negative/Trace  ? Glucose, UA Negative Negative  ? Ketones, UA Negative Negative  ? RBC, UA Negative Negative  ? Bilirubin, UA Negative Negative  ? Urobilinogen, Ur 0.2 0.2 - 1.0 mg/dL  ? Nitrite, UA Negative Negative  ? Microscopic Examination See below:   ?Comp Met (CMET)  ?Result Value Ref Range  ? Glucose 93 70 - 99 mg/dL  ? BUN 14 6 - 24 mg/dL  ? Creatinine, Ser 1.00 0.57 - 1.00 mg/dL  ? eGFR 68 >59 mL/min/1.73  ? BUN/Creatinine Ratio 14 9 - 23  ? Sodium 145 (H) 134 - 144 mmol/L  ? Potassium 3.9 3.5 - 5.2 mmol/L  ? Chloride 106 96 - 106 mmol/L  ? CO2 20 20 - 29 mmol/L  ? Calcium 9.4 8.7 - 10.2 mg/dL  ? Total Protein 7.5 6.0 - 8.5 g/dL  ? Albumin 4.8 3.8 - 4.9 g/dL  ? Globulin, Total 2.7 1.5 - 4.5 g/dL  ? Albumin/Globulin Ratio 1.8 1.2 - 2.2  ? Bilirubin Total 0.4 0.0 - 1.2 mg/dL  ? Alkaline Phosphatase 124 (H) 44 - 121 IU/L  ? AST 31 0 - 40 IU/L  ? ALT 46 (H) 0 - 32 IU/L  ? ?   ?Assessment & Plan:  ? ?Problem List Items Addressed This Visit   ? ?  ? Cardiovascular and Mediastinum  ? Hypertension - Primary  ?  Chronic. Not well controlled.  Patient is tolerating medication well.  Will increase dose to Lisinopril 55m daily.  Will follow up with patient in 1 month for reevaluation.  Will add amlodipine if needed at next visit.    ?  ?  ? Relevant Medications  ? lisinopril (ZESTRIL) 40 MG tablet  ?  ? ?Follow up plan: ?Return in about 1 month (around 02/12/2022) for BP Check. ? ? ? ? ? ?

## 2022-01-13 ENCOUNTER — Encounter: Payer: Self-pay | Admitting: Nurse Practitioner

## 2022-01-13 ENCOUNTER — Ambulatory Visit: Payer: 59 | Admitting: Nurse Practitioner

## 2022-01-13 VITALS — BP 161/94 | HR 76 | Temp 97.7°F | Wt 164.2 lb

## 2022-01-13 DIAGNOSIS — I10 Essential (primary) hypertension: Secondary | ICD-10-CM

## 2022-01-13 MED ORDER — LISINOPRIL 40 MG PO TABS: 40.0000 mg | ORAL_TABLET | Freq: Every day | ORAL | 0 refills | Status: DC

## 2022-01-13 NOTE — Assessment & Plan Note (Signed)
Chronic. Not well controlled.  Patient is tolerating medication well.  Will increase dose to Lisinopril 40mg  daily.  Will follow up with patient in 1 month for reevaluation.  Will add amlodipine if needed at next visit.    ?

## 2022-01-19 ENCOUNTER — Encounter: Admission: RE | Payer: Self-pay | Source: Home / Self Care

## 2022-01-19 ENCOUNTER — Ambulatory Visit: Admission: RE | Admit: 2022-01-19 | Payer: 59 | Source: Home / Self Care | Admitting: Gastroenterology

## 2022-01-19 SURGERY — COLONOSCOPY WITH PROPOFOL
Anesthesia: General

## 2022-02-09 ENCOUNTER — Ambulatory Visit: Payer: Self-pay

## 2022-02-09 MED ORDER — LISINOPRIL 40 MG PO TABS: 40.0000 mg | ORAL_TABLET | Freq: Every day | ORAL | 0 refills | Status: DC

## 2022-02-09 NOTE — Telephone Encounter (Signed)
Reason for CRM: The patient is currently taking lisinopril (ZESTRIL) 40 MG tablet [[id number]]  ? ?The patient is scheduled to be seen on 02/17/22 and has 3 tablets remaining of the medication  ? ?The patient would like to know if they should request a refill and continue taking their medication prior to the appointment or wait to be seen before continuing the medication  ? ? ? ? ?Chief Complaint: Pt. Will run out of lisinopril before her appointment. Medication refilled. ?Symptoms: No ?Frequency: n/a ?Pertinent Negatives: Patient denies n/a ?Disposition: [] ED /[] Urgent Care (no appt availability in office) / [] Appointment(In office/virtual)/ []  [facility]/ [] Home Care/ [] Refused Recommended Disposition /[] [facility]/ [x]  Follow-up with PCP ?Additional Notes:   ?Answer Assessment - Initial Assessment Questions ?1. DRUG NAME: "What medicine do you need to have refilled?" ?    Lisinopril ?2. REFILLS REMAINING: "How many refills are remaining?" (Note: The label on the medicine or pill bottle will show how many refills are remaining. If there are no refills remaining, then a renewal may be needed.) ?    0 ?3. EXPIRATION DATE: "What is the expiration date?" (Note: The label states when the prescription will expire, and thus can no longer be refilled.) ?    Unsure ?4. PRESCRIBING HCP: "Who prescribed it?" Reason: If prescribed by specialist, call should be referred to that group. ?    [facility] ?5. SYMPTOMS: "Do you have any symptoms?" ?    No ?6. PREGNANCY: "Is there any chance that you are pregnant?" "When was your last menstrual period?" ?    No ? ?Protocols used: Medication Refill and Renewal Call-A-AH ? ?

## 2022-02-17 ENCOUNTER — Encounter: Payer: Self-pay | Admitting: Nurse Practitioner

## 2022-02-17 ENCOUNTER — Ambulatory Visit: Payer: 59 | Admitting: Nurse Practitioner

## 2022-02-17 VITALS — BP 155/92 | HR 71 | Temp 97.9°F | Wt 161.8 lb

## 2022-02-17 DIAGNOSIS — I10 Essential (primary) hypertension: Secondary | ICD-10-CM | POA: Diagnosis not present

## 2022-02-17 MED ORDER — AMLODIPINE BESYLATE 10 MG PO TABS: 10.0000 mg | ORAL_TABLET | Freq: Every day | ORAL | 1 refills | Status: DC

## 2022-02-17 NOTE — Progress Notes (Signed)
? ?BP (!) 155/92   Pulse 71   Temp 97.9 ?F (36.6 ?C) (Oral)   Wt 161 lb 12.8 oz (73.4 kg)   LMP 02/12/2022 (Exact Date)   SpO2 99%   BMI 33.01 kg/m?   ? ?Subjective:  ? ? Patient ID: Sherry Bass, female    DOB: November 06, 1970, 51 y.o.   MRN: [id number] ? ?HPI: ?Sherry Bass is a 51 y.o. female ? ?Chief Complaint  ?Patient presents with  ? Hypertension  ?  1 month f/up   ? ?HYPERTENSION ?Hypertension status: uncontrolled  ?Satisfied with current treatment? no ?Duration of hypertension: years ?BP monitoring frequency:  daily ?BP range: 150-160/80-90 ?BP medication side effects:  no ?Medication compliance: excellent compliance ?Previous BP meds:lisinopril ?Aspirin: no ?Recurrent headaches: no ?Visual changes: no ?Palpitations: no ?Dyspnea: no ?Chest pain: no ?Lower extremity edema: no ?Dizzy/lightheaded: no ? ?Relevant past medical, surgical, family and social history reviewed and updated as indicated. Interim medical history since our last visit reviewed. ?Allergies and medications reviewed and updated. ? ?Review of Systems  ?Eyes:  Negative for visual disturbance.  ?Respiratory:  Negative for cough, chest tightness and shortness of breath.   ?Cardiovascular:  Negative for chest pain, palpitations and leg swelling.  ?Neurological:  Negative for dizziness and headaches.  ? ?Per HPI unless specifically indicated above ? ?   ?Objective:  ?  ?BP (!) 155/92   Pulse 71   Temp 97.9 ?F (36.6 ?C) (Oral)   Wt 161 lb 12.8 oz (73.4 kg)   LMP 02/12/2022 (Exact Date)   SpO2 99%   BMI 33.01 kg/m?   ?Wt Readings from Last 3 Encounters:  ?02/17/22 161 lb 12.8 oz (73.4 kg)  ?01/13/22 164 lb 3.2 oz (74.5 kg)  ?12/23/21 165 lb 9.6 oz (75.1 kg)  ?  ?Physical Exam ?Vitals and nursing note reviewed.  ?Constitutional:   ?   General: She is not in acute distress. ?   Appearance: Normal appearance. She is obese. She is not ill-appearing, toxic-appearing or diaphoretic.  ?HENT:  ?   Head: Normocephalic.  ?   Right Ear:  External ear normal.  ?   Left Ear: External ear normal.  ?   Nose: Nose normal.  ?   Mouth/Throat:  ?   Mouth: Mucous membranes are moist.  ?   Pharynx: Oropharynx is clear.  ?Eyes:  ?   General:     ?   Right eye: No discharge.     ?   Left eye: No discharge.  ?   Extraocular Movements: Extraocular movements intact.  ?   Conjunctiva/sclera: Conjunctivae normal.  ?   Pupils: Pupils are equal, round, and reactive to light.  ?Cardiovascular:  ?   Rate and Rhythm: Normal rate and regular rhythm.  ?   Heart sounds: No murmur heard. ?Pulmonary:  ?   Effort: Pulmonary effort is normal. No respiratory distress.  ?   Breath sounds: Normal breath sounds. No wheezing or rales.  ?Musculoskeletal:  ?   Cervical back: Normal range of motion and neck supple.  ?Skin: ?   General: Skin is warm and dry.  ?   Capillary Refill: Capillary refill takes less than 2 seconds.  ?Neurological:  ?   General: No focal deficit present.  ?   Mental Status: She is alert and oriented to person, place, and time. Mental status is at baseline.  ?Psychiatric:     ?   Mood and Affect: Mood normal.     ?  Behavior: Behavior normal.     ?   Thought Content: Thought content normal.     ?   Judgment: Judgment normal.  ? ? ?Results for orders placed or performed in visit on 12/14/21  ?Microscopic Examination  ? Urine  ?Result Value Ref Range  ? WBC, UA 0-5 0 - 5 /hpf  ? RBC None seen 0 - 2 /hpf  ? Epithelial Cells (non renal) 0-10 0 - 10 /hpf  ? Bacteria, UA Few (A) None seen/Few  ?Urine Culture  ? Specimen: Urine  ? UR  ?Result Value Ref Range  ? Urine Culture, Routine Final report   ? Organism ID, Bacteria Comment   ?Urinalysis, Routine w reflex microscopic  ?Result Value Ref Range  ? Specific Gravity, UA 1.015 1.005 - 1.030  ? pH, UA 6.5 5.0 - 7.5  ? Color, UA Yellow Yellow  ? Appearance Ur Clear Clear  ? Leukocytes,UA Negative Negative  ? Protein,UA Trace (A) Negative/Trace  ? Glucose, UA Negative Negative  ? Ketones, UA Negative Negative  ? RBC, UA  Negative Negative  ? Bilirubin, UA Negative Negative  ? Urobilinogen, Ur 0.2 0.2 - 1.0 mg/dL  ? Nitrite, UA Negative Negative  ? Microscopic Examination See below:   ?Comp Met (CMET)  ?Result Value Ref Range  ? Glucose 93 70 - 99 mg/dL  ? BUN 14 6 - 24 mg/dL  ? Creatinine, Ser 1.00 0.57 - 1.00 mg/dL  ? eGFR 68 >59 mL/min/1.73  ? BUN/Creatinine Ratio 14 9 - 23  ? Sodium 145 (H) 134 - 144 mmol/L  ? Potassium 3.9 3.5 - 5.2 mmol/L  ? Chloride 106 96 - 106 mmol/L  ? CO2 20 20 - 29 mmol/L  ? Calcium 9.4 8.7 - 10.2 mg/dL  ? Total Protein 7.5 6.0 - 8.5 g/dL  ? Albumin 4.8 3.8 - 4.9 g/dL  ? Globulin, Total 2.7 1.5 - 4.5 g/dL  ? Albumin/Globulin Ratio 1.8 1.2 - 2.2  ? Bilirubin Total 0.4 0.0 - 1.2 mg/dL  ? Alkaline Phosphatase 124 (H) 44 - 121 IU/L  ? AST 31 0 - 40 IU/L  ? ALT 46 (H) 0 - 32 IU/L  ? ?   ?Assessment & Plan:  ? ?Problem List Items Addressed This Visit   ? ?  ? Cardiovascular and Mediastinum  ? Hypertension - Primary  ?  Chronic. Not well controlled.  Continue with Lisinopril.  Will add amlodipine.  Side effects and benefits of medication discussed during visit.  Continue to check blood pressures at home.  Bring log to next visit.  Follow up in 1 month.  Call sooner if concerns arise.  ? ?  ?  ? Relevant Medications  ? amLODipine (NORVASC) 10 MG tablet  ?  ? ?Follow up plan: ?Return in about 1 month (around 03/20/2022) for BP Check. ? ? ? ? ? ?

## 2022-02-17 NOTE — Assessment & Plan Note (Signed)
Chronic. Not well controlled.  Continue with Lisinopril.  Will add amlodipine.  Side effects and benefits of medication discussed during visit.  Continue to check blood pressures at home.  Bring log to next visit.  Follow up in 1 month.  Call sooner if concerns arise.  ?

## 2022-03-14 ENCOUNTER — Other Ambulatory Visit: Payer: Self-pay | Admitting: Nurse Practitioner

## 2022-03-14 NOTE — Telephone Encounter (Signed)
lisinopril (ZESTRIL) 40 MG tablet Medication Refill - Medication:   Has the patient contacted their pharmacy? Yes.   (Agent: If no, request that the patient contact the pharmacy for the refill. If patient does not wish to contact the pharmacy document the reason why and proceed with request.) (Agent: If yes, when and what did the pharmacy advise?)  Preferred Pharmacy (with phone number or street name):  [id number] [place], [place] - [address] AT [facility] OF [address] [facility]  [address] [place] [address]  Phone: [phone] Fax: [phone]   Has the patient been seen for an appointment in the last year OR does the patient have an upcoming appointment? Yes.    Agent: Please be advised that RX refills may take up to 3 business days. We ask that you follow-up with your pharmacy.

## 2022-03-15 MED ORDER — LISINOPRIL 40 MG PO TABS
40.0000 mg | ORAL_TABLET | Freq: Every day | ORAL | 0 refills | Status: DC
Start: 2022-03-15 — End: 2022-03-24

## 2022-03-15 NOTE — Telephone Encounter (Signed)
Requested Prescriptions  Pending Prescriptions Disp Refills  . lisinopril (ZESTRIL) 40 MG tablet 30 tablet 0    Sig: Take 1 tablet (40 mg total) by mouth daily.     Cardiovascular:  ACE Inhibitors Failed - 03/14/2022  4:59 PM      Failed - Last BP in normal range    BP Readings from Last 1 Encounters:  02/17/22 (!) 155/92         Passed - Cr in normal range and within 180 days    Creatinine, Ser  Date Value Ref Range Status  12/14/2021 1.00 0.57 - 1.00 mg/dL Final         Passed - K in normal range and within 180 days    Potassium  Date Value Ref Range Status  12/14/2021 3.9 3.5 - 5.2 mmol/L Final         Passed - Patient is not pregnant      Passed - Valid encounter within last 6 months    Recent Outpatient Visits          3 weeks ago Primary hypertension   Donna, Karen, NP   2 months ago Primary hypertension   [facility] Jon Billings, NP   2 months ago Primary hypertension   [facility] Jon Billings, NP   3 months ago Primary hypertension   [facility] Jon Billings, NP   3 months ago Encounter to establish care   [facility] Jon Billings, NP      Future Appointments            In 1 week Jon Billings, NP [facility], [place]

## 2022-03-21 ENCOUNTER — Ambulatory Visit: Payer: 59 | Admitting: Nurse Practitioner

## 2022-03-23 NOTE — Progress Notes (Signed)
BP 132/88   Pulse 74   Temp 98.4 F (36.9 C) (Oral)   Wt 160 lb 12.8 oz (72.9 kg)   LMP 03/17/2022   SpO2 97%   BMI 32.81 kg/m    Subjective:    Patient ID: Sherry Bass, female    DOB: Oct 14, 1970, 51 y.o.   MRN: [id number]  HPI: Sherry Bass is a 51 y.o. female  Chief Complaint  Patient presents with   Hypertension   HYPERTENSION Hypertension status: uncontrolled  Satisfied with current treatment? no Duration of hypertension: years BP monitoring frequency:  daily BP range: 117/75 BP medication side effects:  no Medication compliance: excellent compliance Previous BP meds:lisinopril and amlodipine Aspirin: no Recurrent headaches: no Visual changes: no Palpitations: no Dyspnea: no Chest pain: no Lower extremity edema: no Dizzy/lightheaded: no  Relevant past medical, surgical, family and social history reviewed and updated as indicated. Interim medical history since our last visit reviewed. Allergies and medications reviewed and updated.  Review of Systems  Eyes:  Negative for visual disturbance.  Respiratory:  Negative for cough, chest tightness and shortness of breath.   Cardiovascular:  Negative for chest pain, palpitations and leg swelling.  Neurological:  Negative for dizziness and headaches.    Per HPI unless specifically indicated above     Objective:    BP 132/88   Pulse 74   Temp 98.4 F (36.9 C) (Oral)   Wt 160 lb 12.8 oz (72.9 kg)   LMP 03/17/2022   SpO2 97%   BMI 32.81 kg/m   Wt Readings from Last 3 Encounters:  03/24/22 160 lb 12.8 oz (72.9 kg)  02/17/22 161 lb 12.8 oz (73.4 kg)  01/13/22 164 lb 3.2 oz (74.5 kg)    Physical Exam Vitals and nursing note reviewed.  Constitutional:      General: She is not in acute distress.    Appearance: Normal appearance. She is obese. She is not ill-appearing, toxic-appearing or diaphoretic.  HENT:     Head: Normocephalic.     Right Ear: External ear normal.     Left Ear:  External ear normal.     Nose: Nose normal.     Mouth/Throat:     Mouth: Mucous membranes are moist.     Pharynx: Oropharynx is clear.  Eyes:     General:        Right eye: No discharge.        Left eye: No discharge.     Extraocular Movements: Extraocular movements intact.     Conjunctiva/sclera: Conjunctivae normal.     Pupils: Pupils are equal, round, and reactive to light.  Cardiovascular:     Rate and Rhythm: Normal rate and regular rhythm.     Heart sounds: No murmur heard. Pulmonary:     Effort: Pulmonary effort is normal. No respiratory distress.     Breath sounds: Normal breath sounds. No wheezing or rales.  Musculoskeletal:     Cervical back: Normal range of motion and neck supple.  Skin:    General: Skin is warm and dry.     Capillary Refill: Capillary refill takes less than 2 seconds.  Neurological:     General: No focal deficit present.     Mental Status: She is alert and oriented to person, place, and time. Mental status is at baseline.  Psychiatric:        Mood and Affect: Mood normal.        Behavior: Behavior normal.  Thought Content: Thought content normal.        Judgment: Judgment normal.     Results for orders placed or performed in visit on 12/14/21  Microscopic Examination   Urine  Result Value Ref Range   WBC, UA 0-5 0 - 5 /hpf   RBC None seen 0 - 2 /hpf   Epithelial Cells (non renal) 0-10 0 - 10 /hpf   Bacteria, UA Few (A) None seen/Few  Urine Culture   Specimen: Urine   UR  Result Value Ref Range   Urine Culture, Routine Final report    Organism ID, Bacteria Comment   Urinalysis, Routine w reflex microscopic  Result Value Ref Range   Specific Gravity, UA 1.015 1.005 - 1.030   pH, UA 6.5 5.0 - 7.5   Color, UA Yellow Yellow   Appearance Ur Clear Clear   Leukocytes,UA Negative Negative   Protein,UA Trace (A) Negative/Trace   Glucose, UA Negative Negative   Ketones, UA Negative Negative   RBC, UA Negative Negative   Bilirubin, UA  Negative Negative   Urobilinogen, Ur 0.2 0.2 - 1.0 mg/dL   Nitrite, UA Negative Negative   Microscopic Examination See below:   Comp Met (CMET)  Result Value Ref Range   Glucose 93 70 - 99 mg/dL   BUN 14 6 - 24 mg/dL   Creatinine, Ser 1.00 0.57 - 1.00 mg/dL   eGFR 68 >59 mL/min/1.73   BUN/Creatinine Ratio 14 9 - 23   Sodium 145 (H) 134 - 144 mmol/L   Potassium 3.9 3.5 - 5.2 mmol/L   Chloride 106 96 - 106 mmol/L   CO2 20 20 - 29 mmol/L   Calcium 9.4 8.7 - 10.2 mg/dL   Total Protein 7.5 6.0 - 8.5 g/dL   Albumin 4.8 3.8 - 4.9 g/dL   Globulin, Total 2.7 1.5 - 4.5 g/dL   Albumin/Globulin Ratio 1.8 1.2 - 2.2   Bilirubin Total 0.4 0.0 - 1.2 mg/dL   Alkaline Phosphatase 124 (H) 44 - 121 IU/L   AST 31 0 - 40 IU/L   ALT 46 (H) 0 - 32 IU/L      Assessment & Plan:   Problem List Items Addressed This Visit       Cardiovascular and Mediastinum   Hypertension - Primary    Chronic.  Controlled.  Continue with current medication regimen of lisinopril and Amlodipine.  Refills sent today. Return to clinic in 6 months for reevaluation.  Call sooner if concerns arise.        Relevant Medications   lisinopril (ZESTRIL) 40 MG tablet     Follow up plan: Return in about 3 months (around 06/24/2022) for HTN, HLD, DM2 FU.

## 2022-03-24 ENCOUNTER — Ambulatory Visit: Payer: 59 | Admitting: Nurse Practitioner

## 2022-03-24 ENCOUNTER — Encounter: Payer: Self-pay | Admitting: Nurse Practitioner

## 2022-03-24 VITALS — BP 132/88 | HR 74 | Temp 98.4°F | Wt 160.8 lb

## 2022-03-24 DIAGNOSIS — I10 Essential (primary) hypertension: Secondary | ICD-10-CM | POA: Diagnosis not present

## 2022-03-24 MED ORDER — LISINOPRIL 40 MG PO TABS: 40.0000 mg | ORAL_TABLET | Freq: Every day | ORAL | 1 refills | Status: DC

## 2022-03-24 NOTE — Assessment & Plan Note (Signed)
Chronic.  Controlled.  Continue with current medication regimen of lisinopril and Amlodipine.  Refills sent today. Return to clinic in 6 months for reevaluation.  Call sooner if concerns arise.

## 2022-04-16 ENCOUNTER — Other Ambulatory Visit: Payer: Self-pay | Admitting: Nurse Practitioner

## 2022-04-18 NOTE — Telephone Encounter (Signed)
Requested medication (s) are due for refill today: no  Requested medication (s) are on the active medication list: yes  Last refill:  03/24/22  Future visit scheduled: yes  Notes to clinic:  Unable to refill per protocol, Rx request is too soon. Last refill 03/24/22 for 90, and 1 refill.     Requested Prescriptions  Pending Prescriptions Disp Refills   lisinopril (ZESTRIL) 40 MG tablet [Pharmacy Med Name: LISINOPRIL 40MG  TABLETS] 30 tablet     Sig: TAKE 1 TABLET(40 MG) BY MOUTH DAILY     Cardiovascular:  ACE Inhibitors Passed - 04/16/2022 10:02 PM      Passed - Cr in normal range and within 180 days    Creatinine, Ser  Date Value Ref Range Status  12/14/2021 1.00 0.57 - 1.00 mg/dL Final         Passed - K in normal range and within 180 days    Potassium  Date Value Ref Range Status  12/14/2021 3.9 3.5 - 5.2 mmol/L Final         Passed - Patient is not pregnant      Passed - Last BP in normal range    BP Readings from Last 1 Encounters:  03/24/22 132/88         Passed - Valid encounter within last 6 months    Recent Outpatient Visits           3 weeks ago Primary hypertension   [facility], NP   2 months ago Primary hypertension   [facility], NP   3 months ago Primary hypertension   [facility], NP   3 months ago Primary hypertension   [facility], NP   4 months ago Primary hypertension   [facility], NP       Future Appointments             In 2 months Larae Grooms, NP [facility], [facility]

## 2022-06-24 ENCOUNTER — Ambulatory Visit: Payer: 59 | Admitting: Nurse Practitioner

## 2022-06-27 NOTE — Progress Notes (Unsigned)
There were no vitals taken for this visit.   Subjective:    Patient ID: Sherry Bass, female    DOB: [birth year], 51 y.o.   MRN: [id number]  HPI: Rekha Hobbins is a 51 y.o. female presenting on 06/28/2022 for comprehensive medical examination. Current medical complaints include:{Blank single:19197::"none","***"}  She currently lives with: Menopausal Symptoms: {Blank single:19197::"yes","no"}  HYPERTENSION {Blank single:19197::"without","with"} Chronic Kidney Disease Hypertension status: {Blank single:19197::"controlled","uncontrolled","better","worse","exacerbated","stable"}  Satisfied with current treatment? {Blank single:19197::"yes","no"} Duration of hypertension: {Blank single:19197::"chronic","months","years"} BP monitoring frequency:  {Blank single:19197::"not checking","rarely","daily","weekly","monthly","a few times a day","a few times a week","a few times a month"} BP range:  BP medication side effects:  {Blank single:19197::"yes","no"} Medication compliance: {Blank single:19197::"excellent compliance","good compliance","fair compliance","poor compliance"} Previous BP meds:{Blank VOZDGUYQ:03474::"QVZD","GLOVFIEPPI","RJJOACZYSA/YTKZSWFUXN","ATFTDDUK","GURKYHCWCB","JSEGBTDVVO/HYWV","PXTGGYIRSW (bystolic)","carvedilol","chlorthalidone","clonidine","diltiazem","exforge HCT","HCTZ","irbesartan (avapro)","labetalol","lisinopril","lisinopril-HCTZ","losartan (cozaar)","methyldopa","nifedipine","olmesartan (benicar)","olmesartan-HCTZ","quinapril","ramipril","spironalactone","tekturna","valsartan","valsartan-HCTZ","verapamil"} Aspirin: {Blank single:19197::"yes","no"} Recurrent headaches: {Blank single:19197::"yes","no"} Visual changes: {Blank single:19197::"yes","no"} Palpitations: {Blank single:19197::"yes","no"} Dyspnea: {Blank single:19197::"yes","no"} Chest pain: {Blank single:19197::"yes","no"} Lower extremity edema: {Blank  single:19197::"yes","no"} Dizzy/lightheaded: {Blank single:19197::"yes","no"}  Depression Screen done today and results listed below:     03/24/2022   10:12 AM 02/17/2022   10:29 AM 01/13/2022   11:02 AM 12/23/2021   10:09 AM 12/14/2021    9:35 AM  Depression screen PHQ 2/9  Decreased Interest 1 1 1 1  0  Down, Depressed, Hopeless 1 0 1 1 0  PHQ - 2 Score 2 1 2 2  0  Altered sleeping 1 1 1 1 1   Tired, decreased energy 1 1 1 1 1   Change in appetite 1 1 1 1 1   Feeling bad or failure about yourself  1 0 1 1 1   Trouble concentrating 1 0 1 1 0  Moving slowly or fidgety/restless 0 0 0 0 0  Suicidal thoughts 0 0 0 0 0  PHQ-9 Score 7 4 7 7 4   Difficult doing work/chores Not difficult at all Not difficult at all Not difficult at all Somewhat difficult Somewhat difficult    The patient {has/does not NIOE:70350} a history of falls. I {did/did not:19850} complete a risk assessment for falls. A plan of care for falls {was/was not:19852} documented.   Past Medical History:  Past Medical History:  Diagnosis Date   Achilles tendonitis    Allergic rhinitis    Allergy    Hypertension    Raynaud disease     Surgical History:  Past Surgical History:  Procedure Laterality Date   CESAREAN SECTION     x 2    Medications:  Current Outpatient Medications on File Prior to Visit  Medication Sig   amLODipine (NORVASC) 10 MG tablet Take 1 tablet (10 mg total) by mouth daily.   lisinopril (ZESTRIL) 40 MG tablet Take 1 tablet (40 mg total) by mouth daily.   No current facility-administered medications on file prior to visit.    Allergies:  Allergies  Allergen Reactions   Prednisone Nausea And Vomiting    Social History:  Social History   Socioeconomic History   Marital status: Married    Spouse name: Not on file   Number of children: Not on file   Years of education: Not on file   Highest education level: Not on file  Occupational History   Not on file  Tobacco Use   Smoking status:  Never   Smokeless tobacco: Never  Vaping Use   Vaping Use: Never used  Substance and Sexual Activity   Alcohol use: No   Drug use: No   Sexual activity: Yes  Other Topics Concern   Not on file  Social History  Narrative   Not on file   Social Determinants of Health   Financial Resource Strain: Not on file  Food Insecurity: Not on file  Transportation Needs: Not on file  Physical Activity: Sufficiently Active (11/16/2017)   Exercise Vital Sign    Days of Exercise per Week: 5 days    Minutes of Exercise per Session: 60 min  Stress: No Stress Concern Present (11/16/2017)   [place]    Feeling of Stress : Only a little  Social Connections: Somewhat Isolated (11/16/2017)   Social Connection and Isolation Panel [NHANES]    Frequency of Communication with Friends and Family: Once a week    Frequency of Social Gatherings with Friends and Family: Never    Attends Religious Services: More than 4 times per year    Active Member of [employer] or Organizations: No    Attends Archivist Meetings: Never    Marital Status: Married  Human resources officer Violence: Not At Risk (11/16/2017)   Humiliation, Afraid, Rape, and Kick questionnaire    Fear of Current or Ex-Partner: No    Emotionally Abused: No    Physically Abused: No    Sexually Abused: No   Social History   Tobacco Use  Smoking Status Never  Smokeless Tobacco Never   Social History   Substance and Sexual Activity  Alcohol Use No    Family History:  Family History  Problem Relation Age of Onset   Diabetes Mother    Hypertension Mother    Congestive Heart Failure Mother    Stroke Father    Hyperlipidemia Father    Stroke Sister    Stroke Maternal Grandfather    Dementia Paternal Grandmother    Heart disease Paternal Grandfather     Past medical history, surgical history, medications, allergies, family history and social history reviewed with patient  today and changes made to appropriate areas of the chart.   ROS All other ROS negative except what is listed above and in the HPI.      Objective:    There were no vitals taken for this visit.  Wt Readings from Last 3 Encounters:  03/24/22 160 lb 12.8 oz (72.9 kg)  02/17/22 161 lb 12.8 oz (73.4 kg)  01/13/22 164 lb 3.2 oz (74.5 kg)    Physical Exam  Results for orders placed or performed in visit on 12/14/21  Microscopic Examination   Urine  Result Value Ref Range   WBC, UA 0-5 0 - 5 /hpf   RBC, Urine None seen 0 - 2 /hpf   Epithelial Cells (non renal) 0-10 0 - 10 /hpf   Bacteria, UA Few (A) None seen/Few  Urine Culture   Specimen: Urine   UR  Result Value Ref Range   Urine Culture, Routine Final report    Organism ID, Bacteria Comment   Urinalysis, Routine w reflex microscopic  Result Value Ref Range   Specific Gravity, UA 1.015 1.005 - 1.030   pH, UA 6.5 5.0 - 7.5   Color, UA Yellow Yellow   Appearance Ur Clear Clear   Leukocytes,UA Negative Negative   Protein,UA Trace (A) Negative/Trace   Glucose, UA Negative Negative   Ketones, UA Negative Negative   RBC, UA Negative Negative   Bilirubin, UA Negative Negative   Urobilinogen, Ur 0.2 0.2 - 1.0 mg/dL   Nitrite, UA Negative Negative   Microscopic Examination See below:   Comp Met (CMET)  Result Value Ref Range  Glucose 93 70 - 99 mg/dL   BUN 14 6 - 24 mg/dL   Creatinine, Ser 1.00 0.57 - 1.00 mg/dL   eGFR 68 >59 mL/min/1.73   BUN/Creatinine Ratio 14 9 - 23   Sodium 145 (H) 134 - 144 mmol/L   Potassium 3.9 3.5 - 5.2 mmol/L   Chloride 106 96 - 106 mmol/L   CO2 20 20 - 29 mmol/L   Calcium 9.4 8.7 - 10.2 mg/dL   Total Protein 7.5 6.0 - 8.5 g/dL   Albumin 4.8 3.8 - 4.9 g/dL   Globulin, Total 2.7 1.5 - 4.5 g/dL   Albumin/Globulin Ratio 1.8 1.2 - 2.2   Bilirubin Total 0.4 0.0 - 1.2 mg/dL   Alkaline Phosphatase 124 (H) 44 - 121 IU/L   AST 31 0 - 40 IU/L   ALT 46 (H) 0 - 32 IU/L      Assessment & Plan:    Problem List Items Addressed This Visit       Cardiovascular and Mediastinum   Hypertension - Primary     Follow up plan: No follow-ups on file.   LABORATORY TESTING:  - Pap smear: {Blank VOHYWV:37106::"YIR done","not applicable","up to date","done elsewhere"}  IMMUNIZATIONS:   - Tdap: Tetanus vaccination status reviewed: {tetanus status:315746}. - Influenza: {Blank single:19197::"Up to date","Administered today","Postponed to flu season","Refused","Given elsewhere"} - Pneumovax: {Blank single:19197::"Up to date","Administered today","Not applicable","Refused","Given elsewhere"} - Prevnar: {Blank single:19197::"Up to date","Administered today","Not applicable","Refused","Given elsewhere"} - COVID: {Blank single:19197::"Up to date","Administered today","Not applicable","Refused","Given elsewhere"} - HPV: {Blank single:19197::"Up to date","Administered today","Not applicable","Refused","Given elsewhere"} - Shingrix vaccine: {Blank single:19197::"Up to date","Administered today","Not applicable","Refused","Given elsewhere"}  SCREENING: -Mammogram: {Blank single:19197::"Up to date","Ordered today","Not applicable","Refused","Done elsewhere"}  - Colonoscopy: {Blank single:19197::"Up to date","Ordered today","Not applicable","Refused","Done elsewhere"}  - Bone Density: {Blank single:19197::"Up to date","Ordered today","Not applicable","Refused","Done elsewhere"}  -Hearing Test: {Blank single:19197::"Up to date","Ordered today","Not applicable","Refused","Done elsewhere"}  -Spirometry: {Blank single:19197::"Up to date","Ordered today","Not applicable","Refused","Done elsewhere"}   PATIENT COUNSELING:   Advised to take 1 mg of folate supplement per day if capable of pregnancy.   Sexuality: Discussed sexually transmitted diseases, partner selection, use of condoms, avoidance of unintended pregnancy  and contraceptive alternatives.   Advised to avoid cigarette smoking.  I discussed  with the patient that most people either abstain from alcohol or drink within safe limits (<=14/week and <=4 drinks/occasion for males, <=7/weeks and <= 3 drinks/occasion for females) and that the risk for alcohol disorders and other health effects rises proportionally with the number of drinks per week and how often a drinker exceeds daily limits.  Discussed cessation/primary prevention of drug use and availability of treatment for abuse.   Diet: Encouraged to adjust caloric intake to maintain  or achieve ideal body weight, to reduce intake of dietary saturated fat and total fat, to limit sodium intake by avoiding high sodium foods and not adding table salt, and to maintain adequate dietary potassium and calcium preferably from fresh fruits, vegetables, and low-fat dairy products.    stressed the importance of regular exercise  Injury prevention: Discussed safety belts, safety helmets, smoke detector, smoking near bedding or upholstery.   Dental health: Discussed importance of regular tooth brushing, flossing, and dental visits.    NEXT PREVENTATIVE PHYSICAL DUE IN 1 YEAR. No follow-ups on file.

## 2022-06-28 ENCOUNTER — Ambulatory Visit: Payer: 59 | Admitting: Nurse Practitioner

## 2022-06-28 ENCOUNTER — Encounter: Payer: Self-pay | Admitting: Nurse Practitioner

## 2022-06-28 VITALS — BP 137/88 | HR 72 | Temp 98.9°F | Wt 160.8 lb

## 2022-06-28 DIAGNOSIS — Z1159 Encounter for screening for other viral diseases: Secondary | ICD-10-CM

## 2022-06-28 DIAGNOSIS — I10 Essential (primary) hypertension: Secondary | ICD-10-CM | POA: Diagnosis not present

## 2022-06-28 DIAGNOSIS — Z1331 Encounter for screening for depression: Secondary | ICD-10-CM

## 2022-06-28 DIAGNOSIS — Z0000 Encounter for general adult medical examination without abnormal findings: Secondary | ICD-10-CM | POA: Diagnosis not present

## 2022-06-28 DIAGNOSIS — Z136 Encounter for screening for cardiovascular disorders: Secondary | ICD-10-CM

## 2022-06-28 DIAGNOSIS — Z23 Encounter for immunization: Secondary | ICD-10-CM | POA: Diagnosis not present

## 2022-06-28 DIAGNOSIS — Z1231 Encounter for screening mammogram for malignant neoplasm of breast: Secondary | ICD-10-CM

## 2022-06-28 DIAGNOSIS — Z1211 Encounter for screening for malignant neoplasm of colon: Secondary | ICD-10-CM

## 2022-06-28 DIAGNOSIS — Z114 Encounter for screening for human immunodeficiency virus [HIV]: Secondary | ICD-10-CM

## 2022-06-28 LAB — URINALYSIS, ROUTINE W REFLEX MICROSCOPIC
Bilirubin, UA: NEGATIVE
Glucose, UA: NEGATIVE
Ketones, UA: NEGATIVE
Leukocytes,UA: NEGATIVE
Nitrite, UA: NEGATIVE
Protein,UA: NEGATIVE
RBC, UA: NEGATIVE
Specific Gravity, UA: >1.03 — ABNORMAL HIGH (ref 1.005–1.030)
Urobilinogen, Ur: 0.2 mg/dL (ref 0.2–1.0)
pH, UA: 5.5 (ref 5.0–7.5)

## 2022-06-28 MED ORDER — LISINOPRIL 40 MG PO TABS: 40.0000 mg | ORAL_TABLET | Freq: Every day | ORAL | 1 refills | Status: DC

## 2022-06-28 MED ORDER — AMLODIPINE BESYLATE 10 MG PO TABS: 10.0000 mg | ORAL_TABLET | Freq: Every day | ORAL | 1 refills | Status: DC

## 2022-06-28 NOTE — Assessment & Plan Note (Signed)
Chronic.  Controlled.  Continue with current medication regimen of Amlodipine and Lisinopril.  Refills sent today.  Labs ordered today.  Return to clinic in 6 months for reevaluation.  Call sooner if concerns arise.

## 2022-06-30 LAB — COMPREHENSIVE METABOLIC PANEL
ALT: 44 IU/L — ABNORMAL HIGH (ref 0–32)
AST: 30 IU/L (ref 0–40)
Albumin/Globulin Ratio: 1.7 (ref 1.2–2.2)
Albumin: 4.5 g/dL (ref 3.8–4.9)
Alkaline Phosphatase: 115 IU/L (ref 44–121)
BUN/Creatinine Ratio: 11 (ref 9–23)
BUN: 10 mg/dL (ref 6–24)
Bilirubin Total: 0.5 mg/dL (ref 0.0–1.2)
CO2: 18 mmol/L — ABNORMAL LOW (ref 20–29)
Calcium: 9.1 mg/dL (ref 8.7–10.2)
Chloride: 105 mmol/L (ref 96–106)
Creatinine, Ser: 0.93 mg/dL (ref 0.57–1.00)
Globulin, Total: 2.7 g/dL (ref 1.5–4.5)
Glucose: 85 mg/dL (ref 70–99)
Potassium: 4.2 mmol/L (ref 3.5–5.2)
Sodium: 140 mmol/L (ref 134–144)
Total Protein: 7.2 g/dL (ref 6.0–8.5)
eGFR: 73 mL/min/{1.73_m2} (ref >59)

## 2022-06-30 LAB — CBC WITH DIFFERENTIAL/PLATELET
Basophils Absolute: 0.1 10*3/uL (ref 0.0–0.2)
Basos: 1 %
EOS (ABSOLUTE): 0.3 10*3/uL (ref 0.0–0.4)
Eos: 4 %
Hematocrit: 42.9 % (ref 34.0–46.6)
Hemoglobin: 14.7 g/dL (ref 11.1–15.9)
Immature Grans (Abs): 0.1 10*3/uL (ref 0.0–0.1)
Immature Granulocytes: 1 %
Lymphocytes Absolute: 2.4 10*3/uL (ref 0.7–3.1)
Lymphs: 27 %
MCH: 31 pg (ref 26.6–33.0)
MCHC: 34.3 g/dL (ref 31.5–35.7)
MCV: 91 fL (ref 79–97)
Monocytes Absolute: 0.6 10*3/uL (ref 0.1–0.9)
Monocytes: 6 %
Neutrophils Absolute: 5.6 10*3/uL (ref 1.4–7.0)
Neutrophils: 61 %
Platelets: 299 10*3/uL (ref 150–450)
RBC: 4.74 x10E6/uL (ref 3.77–5.28)
RDW: 12.4 % (ref 11.7–15.4)
WBC: 9 10*3/uL (ref 3.4–10.8)

## 2022-06-30 LAB — LIPID PANEL
Chol/HDL Ratio: 5.2 ratio — ABNORMAL HIGH (ref 0.0–4.4)
Cholesterol, Total: 167 mg/dL (ref 100–199)
HDL: 32 mg/dL — ABNORMAL LOW (ref >39)
LDL Chol Calc (NIH): 113 mg/dL — ABNORMAL HIGH (ref 0–99)
Triglycerides: 118 mg/dL (ref 0–149)
VLDL Cholesterol Cal: 22 mg/dL (ref 5–40)

## 2022-06-30 LAB — HEPATITIS C ANTIBODY: Hep C Virus Ab: NONREACTIVE

## 2022-06-30 LAB — HIV ANTIBODY (ROUTINE TESTING W REFLEX): HIV Screen 4th Generation wRfx: NONREACTIVE

## 2022-06-30 LAB — TSH: TSH: 1.32 u[IU]/mL (ref 0.450–4.500)

## 2022-06-30 NOTE — Progress Notes (Signed)
HI Sherry Bass. It was nice to see you this week.  Your lab work looks good.  Your cholesterol is slightly elevated. No other concerns at this time. Continue with your current medication regimen.  Follow up as discussed.  Please let me know if you have any questions.

## 2022-08-16 ENCOUNTER — Telehealth: Payer: Self-pay

## 2022-08-16 NOTE — Telephone Encounter (Signed)
-----   Message from Jon Billings, NP sent at 08/15/2022  4:37 PM EST ----- Can we schedule her mammogram?

## 2022-08-16 NOTE — Telephone Encounter (Signed)
She doesn't need a referral.  Just needs to make an appt there

## 2022-08-16 NOTE — Telephone Encounter (Signed)
Called patient to inform her of her mammogram appt for Jan 12 at 10:20 AM at [facility], patient states she would prefer to have it done with [facility]. Patient wants to know if she needs a new referral. Please advise.

## 2022-08-16 NOTE — Telephone Encounter (Signed)
Patient has been advised

## 2022-12-08 NOTE — H&P (Signed)
Pre-Procedure H&P   Patient ID: Sherry Bass is a 52 y.o. female.  Gastroenterology Provider: Annamaria Helling, DO  Referring Provider: Jon Billings, NP PCP: Jon Billings, NP  Date: 12/09/2022  HPI Sherry Bass is a 52 y.o. female who presents today for Colonoscopy for Initial colorectal cancer screening .  Regular bowel movements without melena hematochezia  Most recent lab work hemoglobin 14.7 MCV 91 platelets 4 299,000 creatinine 1.0   Past Medical History:  Diagnosis Date   Achilles tendonitis    Allergic rhinitis    Allergy    Hypertension    Raynaud disease     Past Surgical History:  Procedure Laterality Date   CESAREAN SECTION     x 2   DILATION AND CURETTAGE OF UTERUS      Family History No h/o GI disease or malignancy  Review of Systems  Constitutional:  Negative for activity change, appetite change, chills, diaphoresis, fatigue, fever and unexpected weight change.  HENT:  Negative for trouble swallowing and voice change.   Respiratory:  Negative for shortness of breath and wheezing.   Cardiovascular:  Negative for chest pain, palpitations and leg swelling.  Gastrointestinal:  Negative for abdominal distention, abdominal pain, anal bleeding, blood in stool, constipation, diarrhea, nausea, rectal pain and vomiting.  Musculoskeletal:  Negative for arthralgias and myalgias.  Skin:  Negative for color change and pallor.  Neurological:  Negative for dizziness, syncope and weakness.  Psychiatric/Behavioral:  Negative for confusion.   All other systems reviewed and are negative.    Medications No current facility-administered medications on file prior to encounter.   Current Outpatient Medications on File Prior to Encounter  Medication Sig Dispense Refill   amLODipine (NORVASC) 10 MG tablet Take 1 tablet (10 mg total) by mouth daily. 90 tablet 1   lisinopril (ZESTRIL) 40 MG tablet Take 1 tablet (40 mg total) by mouth  daily. 90 tablet 1    Pertinent medications related to GI and procedure were reviewed by me with the patient prior to the procedure   Current Facility-Administered Medications:    0.9 %  sodium chloride infusion, , Intravenous, Continuous, Annamaria Helling, DO, Last Rate: 20 mL/hr at 12/09/22 0816, New Bag at 12/09/22 0816      Allergies  Allergen Reactions   Prednisone Nausea And Vomiting   Allergies were reviewed by me prior to the procedure  Objective   Body mass index is 33.81 kg/m. Vitals:   12/09/22 0754 12/09/22 0801  BP:  117/61  Pulse:  77  Resp:  18  Temp:  (!) 96.8 F (36 C)  TempSrc:  Temporal  SpO2:  100%  Weight: 72.1 kg   Height: 4' 9.5" (1.461 m)      Physical Exam Vitals and nursing note reviewed.  Constitutional:      General: She is not in acute distress.    Appearance: Normal appearance. She is obese. She is not ill-appearing, toxic-appearing or diaphoretic.  HENT:     Head: Normocephalic and atraumatic.     Nose: Nose normal.     Mouth/Throat:     Mouth: Mucous membranes are moist.     Pharynx: Oropharynx is clear.  Eyes:     General: No scleral icterus.    Extraocular Movements: Extraocular movements intact.  Cardiovascular:     Rate and Rhythm: Normal rate and regular rhythm.     Heart sounds: Normal heart sounds. No murmur heard.    No friction rub. No  gallop.  Pulmonary:     Effort: Pulmonary effort is normal. No respiratory distress.     Breath sounds: Normal breath sounds. No wheezing, rhonchi or rales.  Abdominal:     General: Bowel sounds are normal. There is no distension.     Palpations: Abdomen is soft.     Tenderness: There is no abdominal tenderness. There is no guarding or rebound.  Musculoskeletal:     Cervical back: Neck supple.     Right lower leg: No edema.     Left lower leg: No edema.  Skin:    General: Skin is warm and dry.     Coloration: Skin is not jaundiced or pale.  Neurological:     General: No  focal deficit present.     Mental Status: She is alert and oriented to person, place, and time. Mental status is at baseline.  Psychiatric:        Mood and Affect: Mood normal.        Behavior: Behavior normal.        Thought Content: Thought content normal.        Judgment: Judgment normal.      Assessment:  Ms. Sherry Bass is a 52 y.o. female  who presents today for Colonoscopy for Initial colorectal cancer screening .  Plan:  Colonoscopy with possible intervention today  Colonoscopy with possible biopsy, control of bleeding, polypectomy, and interventions as necessary has been discussed with the patient/patient representative. Informed consent was obtained from the patient/patient representative after explaining the indication, nature, and risks of the procedure including but not limited to death, bleeding, perforation, missed neoplasm/lesions, cardiorespiratory compromise, and reaction to medications. Opportunity for questions was given and appropriate answers were provided. Patient/patient representative has verbalized understanding is amenable to undergoing the procedure.   Annamaria Helling, DO  [facility]  Portions of the record may have been created with voice recognition software. Occasional wrong-word or 'sound-a-like' substitutions may have occurred due to the inherent limitations of voice recognition software.  Read the chart carefully and recognize, using context, where substitutions may have occurred.

## 2022-12-09 ENCOUNTER — Encounter: Payer: Self-pay | Admitting: Gastroenterology

## 2022-12-09 ENCOUNTER — Ambulatory Visit: Payer: 59 | Admitting: Anesthesiology

## 2022-12-09 ENCOUNTER — Encounter
Admission: RE | Disposition: A | Discharge status: Home / Self Care | Payer: Self-pay | Source: Home / Self Care | Attending: Gastroenterology

## 2022-12-09 ENCOUNTER — Ambulatory Visit
Admission: RE | Admit: 2022-12-09 | Discharge: 2022-12-09 | Disposition: A | Discharge status: Home / Self Care | Payer: 59 | Attending: Gastroenterology | Admitting: Gastroenterology

## 2022-12-09 DIAGNOSIS — I10 Essential (primary) hypertension: Secondary | ICD-10-CM | POA: Diagnosis not present

## 2022-12-09 DIAGNOSIS — K5730 Diverticulosis of large intestine without perforation or abscess without bleeding: Secondary | ICD-10-CM | POA: Diagnosis not present

## 2022-12-09 DIAGNOSIS — E669 Obesity, unspecified: Secondary | ICD-10-CM | POA: Diagnosis not present

## 2022-12-09 DIAGNOSIS — Z1211 Encounter for screening for malignant neoplasm of colon: Secondary | ICD-10-CM | POA: Diagnosis present

## 2022-12-09 DIAGNOSIS — Z6833 Body mass index (BMI) 33.0-33.9, adult: Secondary | ICD-10-CM | POA: Insufficient documentation

## 2022-12-09 DIAGNOSIS — K635 Polyp of colon: Secondary | ICD-10-CM | POA: Insufficient documentation

## 2022-12-09 HISTORY — PX: COLONOSCOPY: SHX5424

## 2022-12-09 LAB — POCT PREGNANCY, URINE: Preg Test, Ur: NEGATIVE

## 2022-12-09 LAB — HM COLONOSCOPY

## 2022-12-09 SURGERY — COLONOSCOPY
Anesthesia: General

## 2022-12-09 MED ORDER — LIDOCAINE HCL (CARDIAC) PF 100 MG/5ML IV SOSY
PREFILLED_SYRINGE | INTRAVENOUS | Status: DC | PRN
  Administered 2022-12-09: 50 mg via INTRAVENOUS

## 2022-12-09 MED ORDER — SODIUM CHLORIDE 0.9 % IV SOLN: INTRAVENOUS | Status: DC

## 2022-12-09 MED ORDER — PROPOFOL 10 MG/ML IV BOLUS
INTRAVENOUS | Status: DC | PRN
  Administered 2022-12-09 (×5): 40 mg via INTRAVENOUS
  Administered 2022-12-09: 50 mg via INTRAVENOUS
  Administered 2022-12-09: 100 mg via INTRAVENOUS

## 2022-12-09 NOTE — Anesthesia Preprocedure Evaluation (Addendum)
Anesthesia Evaluation  Patient identified by MRN, date of birth, ID band Patient awake    Reviewed: Allergy & Precautions, H&P , NPO status , Patient's Chart, lab work & pertinent test results  Airway Mallampati: II  TM Distance: >3 FB Neck ROM: full    Dental no notable dental hx.    Pulmonary neg pulmonary ROS   Pulmonary exam normal        Cardiovascular hypertension, Normal cardiovascular exam     Neuro/Psych negative neurological ROS  negative psych ROS   GI/Hepatic negative GI ROS, Neg liver ROS,,,  Endo/Other  negative endocrine ROS    Renal/GU negative Renal ROS  negative genitourinary   Musculoskeletal   Abdominal  (+) + obese  Peds  Hematology negative hematology ROS (+)   Anesthesia Other Findings Past Medical History: No date: Achilles tendonitis No date: Allergic rhinitis No date: Allergy No date: Hypertension No date: Raynaud disease  Past Surgical History: No date: CESAREAN SECTION     Comment:  x 2     Reproductive/Obstetrics negative OB ROS                             Anesthesia Physical Anesthesia Plan  ASA: 2  Anesthesia Plan: General   Post-op Pain Management:    Induction: Intravenous  PONV Risk Score and Plan: Propofol infusion and TIVA  Airway Management Planned: Natural Airway  Additional Equipment:   Intra-op Plan:   Post-operative Plan:   Informed Consent: I have reviewed the patients History and Physical, chart, labs and discussed the procedure including the risks, benefits and alternatives for the proposed anesthesia with the patient or authorized representative who has indicated his/her understanding and acceptance.     Dental Advisory Given  Plan Discussed with: CRNA and Surgeon  Anesthesia Plan Comments:         Anesthesia Quick Evaluation

## 2022-12-09 NOTE — Anesthesia Postprocedure Evaluation (Signed)
Anesthesia Post Note  Patient: Sherry Bass  Procedure(s) Performed: COLONOSCOPY  Patient location during evaluation: PACU Anesthesia Type: General Level of consciousness: awake and alert Pain management: pain level controlled Vital Signs Assessment: post-procedure vital signs reviewed and stable Respiratory status: spontaneous breathing, nonlabored ventilation and respiratory function stable Cardiovascular status: blood pressure returned to baseline and stable Postop Assessment: no apparent nausea or vomiting Anesthetic complications: no   No notable events documented.   Last Vitals:  Vitals:   12/09/22 0906 12/09/22 0916  BP: 99/64 112/71  Pulse: 72 69  Resp: 11 18  Temp:    SpO2: 100% 96%    Last Pain:  Vitals:   12/09/22 0916  TempSrc:   PainSc: 0-No pain                 [place] Ouch

## 2022-12-09 NOTE — Interval H&P Note (Signed)
History and Physical Interval Note: Preprocedure H&P from 12/09/22  was reviewed and there was no interval change after seeing and examining the patient.  Written consent was obtained from the patient after discussion of risks, benefits, and alternatives. Patient has consented to proceed with Colonoscopy with possible intervention   12/09/2022 8:18 AM  Sherry Bass  has presented today for surgery, with the diagnosis of Colon cancer screening (Z12.11).  The various methods of treatment have been discussed with the patient and family. After consideration of risks, benefits and other options for treatment, the patient has consented to  Procedure(s): COLONOSCOPY (N/A) as a surgical intervention.  The patient's history has been reviewed, patient examined, no change in status, stable for surgery.  I have reviewed the patient's chart and labs.  Questions were answered to the patient's satisfaction.     Annamaria Helling

## 2022-12-09 NOTE — Transfer of Care (Signed)
Immediate Anesthesia Transfer of Care Note  Patient: Sherry Bass  Procedure(s) Performed: COLONOSCOPY  Patient Location: Endoscopy Unit  Anesthesia Type:General  Level of Consciousness: awake, alert , and oriented  Airway & Oxygen Therapy: Patient Spontanous Breathing  Post-op Assessment: Report given to RN, Post -op Vital signs reviewed and stable, and Patient moving all extremities  Post vital signs: Reviewed and stable  Last Vitals:  Vitals Value Taken Time  BP 86/53 12/09/22 0856  Temp 36.2 C 12/09/22 0856  Pulse 74 12/09/22 0856  Resp 17 12/09/22 0856  SpO2 100 % 12/09/22 0856  Vitals shown include unvalidated device data.  Last Pain:  Vitals:   12/09/22 0856  TempSrc: Temporal  PainSc: 0-No pain         Complications: No notable events documented.

## 2022-12-09 NOTE — Op Note (Signed)
[facility] Patient Name: Sherry Bass Procedure Date: 12/09/2022 8:06 AM MRN: [id number] Account #: [id number] Date of Birth: December 16, 1970 Admit Type: Outpatient Age: 52 Room: [facility] ENDO ROOM 1 Gender: Female Note Status: Finalized Instrument Name: Colonoscope [id number] Procedure:             Colonoscopy Indications:           Screening for colorectal malignant neoplasm Providers:             Rueben Bash, DO Referring MD:          Jon Billings (Referring MD) Medicines:             Monitored Anesthesia Care Complications:         No immediate complications. Estimated blood loss:                         Minimal. Procedure:             Pre-Anesthesia Assessment:                        - Prior to the procedure, a History and Physical was                         performed, and patient medications and allergies were                         reviewed. The patient is competent. The risks and                         benefits of the procedure and the sedation options and                         risks were discussed with the patient. All questions                         were answered and informed consent was obtained.                         Patient identification and proposed procedure were                         verified by the physician, the nurse, the anesthetist                         and the technician in the endoscopy suite. Mental                         Status Examination: alert and oriented. Airway                         Examination: normal oropharyngeal airway and neck                         mobility. Respiratory Examination: clear to                         auscultation. CV Examination: RRR, no murmurs, no S3  or S4. Prophylactic Antibiotics: The patient does not                         require prophylactic antibiotics. Prior                         Anticoagulants: The patient has taken no anticoagulant                          or antiplatelet agents. ASA Grade Assessment: II - A                         patient with mild systemic disease. After reviewing                         the risks and benefits, the patient was deemed in                         satisfactory condition to undergo the procedure. The                         anesthesia plan was to use monitored anesthesia care                         (MAC). Immediately prior to administration of                         medications, the patient was re-assessed for adequacy                         to receive sedatives. The heart rate, respiratory                         rate, oxygen saturations, blood pressure, adequacy of                         pulmonary ventilation, and response to care were                         monitored throughout the procedure. The physical                         status of the patient was re-assessed after the                         procedure.                        After obtaining informed consent, the colonoscope was                         passed under direct vision. Throughout the procedure,                         the patient's blood pressure, pulse, and oxygen                         saturations were monitored continuously. The  Colonoscope was introduced through the anus and                         advanced to the the cecum, identified by appendiceal                         orifice and ileocecal valve. The colonoscopy was                         somewhat difficult due to restricted mobility of the                         colon. Successful completion of the procedure was                         aided by withdrawing the scope and replacing with the                         pediatric colonoscope and lavage. The patient                         tolerated the procedure well. The quality of the bowel                         preparation was evaluated using the BBPS [facility] Bowel                          Preparation Scale) with scores of: Right Colon = 3,                         Transverse Colon = 3 and Left Colon = 3 (entire mucosa                         seen well with no residual staining, small fragments                         of stool or opaque liquid). The total BBPS score                         equals 9. The ileocecal valve, appendiceal orifice,                         and rectum were photographed. Findings:      The perianal and digital rectal examinations were normal. Pertinent       negatives include normal sphincter tone.      A 1 mm polyp was found in the cecum. The polyp was sessile. The polyp       was removed with a jumbo cold forceps. Resection and retrieval were       complete. Estimated blood loss was minimal.      A few small-mouthed diverticula were found in the recto-sigmoid colon.       Estimated blood loss: none.      Narrowing of the rectosigmoid junction requiring change from adult to       pediatric colonoscope to traverse. Estimated blood loss: none.      The exam was otherwise without abnormality on direct and retroflexion  views. Impression:            - One 1 mm polyp in the cecum, removed with a jumbo                         cold forceps. Resected and retrieved.                        - Diverticulosis in the recto-sigmoid colon.                        - The examination was otherwise normal on direct and                         retroflexion views. Recommendation:        - Patient has a contact number available for                         emergencies. The signs and symptoms of potential                         delayed complications were discussed with the patient.                         Return to normal activities tomorrow. Written                         discharge instructions were provided to the patient.                        - Discharge patient to home.                        - Resume previous diet.                        - Continue  present medications.                        - Await pathology results.                        - Repeat colonoscopy for surveillance based on                         pathology results.                        - Return to referring physician as previously                         scheduled.                        - The findings and recommendations were discussed with                         the patient. Procedure Code(s):     --- Professional ---                        [phone], Colonoscopy, flexible; with biopsy, single or  multiple Diagnosis Code(s):     --- Professional ---                        Z12.11, Encounter for screening for malignant neoplasm                         of colon                        D12.0, Benign neoplasm of cecum                        K57.30, Diverticulosis of large intestine without                         perforation or abscess without bleeding CPT copyright 2022 [employer]. All rights reserved. The codes documented in this report are preliminary and upon coder review may  be revised to meet current compliance requirements. Attending Participation:      I personally performed the entire procedure. Volney American, DO Annamaria Helling DO, DO 12/09/2022 8:58:01 AM This report has been signed electronically. Number of Addenda: 0 Note Initiated On: 12/09/2022 8:06 AM Scope Withdrawal Time: 0 hours 14 minutes 28 seconds  Total Procedure Duration: 0 hours 26 minutes 54 seconds  Estimated Blood Loss:  Estimated blood loss was minimal.      [facility]

## 2022-12-12 ENCOUNTER — Encounter: Payer: Self-pay | Admitting: Gastroenterology

## 2022-12-12 LAB — SURGICAL PATHOLOGY

## 2022-12-26 NOTE — Progress Notes (Unsigned)
   There were no vitals taken for this visit.   Subjective:    Patient ID: Sherry Bass, female    DOB: 1971/07/09, 52 y.o.   MRN: [id number]  HPI: Sherry Bass is a 52 y.o. female  No chief complaint on file.  HYPERTENSION / HYPERLIPIDEMIA Satisfied with current treatment? {Blank single:19197::"yes","no"} Duration of hypertension: {Blank single:19197::"chronic","months","years"} BP monitoring frequency: {Blank single:19197::"not checking","rarely","daily","weekly","monthly","a few times a day","a few times a week","a few times a month"} BP range:  BP medication side effects: {Blank single:19197::"yes","no"} Past BP meds: {Blank [id number] (bystolic)","carvedilol","chlorthalidone","clonidine","diltiazem","exforge HCT","HCTZ","irbesartan (avapro)","labetalol","lisinopril","lisinopril-HCTZ","losartan (cozaar)","methyldopa","nifedipine","olmesartan (benicar)","olmesartan-HCTZ","quinapril","ramipril","spironalactone","tekturna","valsartan","valsartan-HCTZ","verapamil"} Duration of hyperlipidemia: {Blank single:19197::"chronic","months","years"} Cholesterol medication side effects: {Blank single:19197::"yes","no"} Cholesterol supplements: {Blank multiple:19196::"none","fish oil","niacin","red yeast rice"} Past cholesterol medications: {Blank multiple:19196::"none","atorvastain (lipitor)","lovastatin (mevacor)","pravastatin (pravachol)","rosuvastatin (crestor)","simvastatin (zocor)","vytorin","fenofibrate (tricor)","gemfibrozil","ezetimide (zetia)","niaspan","lovaza"} Medication compliance: {Blank single:19197::"excellent compliance","good compliance","fair compliance","poor compliance"} Aspirin: {Blank single:19197::"yes","no"} Recent stressors: {Blank single:19197::"yes","no"} Recurrent headaches: {Blank single:19197::"yes","no"} Visual changes: {Blank  single:19197::"yes","no"} Palpitations: {Blank single:19197::"yes","no"} Dyspnea: {Blank single:19197::"yes","no"} Chest pain: {Blank single:19197::"yes","no"} Lower extremity edema: {Blank single:19197::"yes","no"} Dizzy/lightheaded: {Blank single:19197::"yes","no"}  Relevant past medical, surgical, family and social history reviewed and updated as indicated. Interim medical history since our last visit reviewed. Allergies and medications reviewed and updated.  Review of Systems  Per HPI unless specifically indicated above     Objective:    There were no vitals taken for this visit.  Wt Readings from Last 3 Encounters:  12/09/22 159 lb (72.1 kg)  06/28/22 160 lb 12.8 oz (72.9 kg)  03/24/22 160 lb 12.8 oz (72.9 kg)    Physical Exam  Results for orders placed or performed during the hospital encounter of 12/09/22  Pregnancy, urine POC  Result Value Ref Range   Preg Test, Ur NEGATIVE NEGATIVE  Surgical pathology  Result Value Ref Range   SURGICAL PATHOLOGY      SURGICAL PATHOLOGY CASE: [phone] PATIENT: [facility]     Specimen Submitted: A. Colon polyp, cecum; cbx  Clinical History: Colon cancer screening Z12.11.  Polyps; diverticulosis      DIAGNOSIS: A.  CECUM, "POLYP"; COLD BIOPSY: - POLYPOID COLONIC MUCOSA WITH BENIGN LYMPHOID HYPERPLASIA. - NEGATIVE FOR DYSPLASIA, GRANULOMA AND MALIGNANCY.   GROSS DESCRIPTION: A. Labeled: Cbx cecal polyp Received: Formalin Collection time: 8:39 AM on 12/09/2022 Placed into formalin time: 8:39 AM on 12/09/2022 Tissue fragment(s): 1 Size: 0.2 x 0.2 x 0.1 cm Description: Tan soft tissue fragment Entirely submitted in 1 cassette.  CM 12/09/2022  Final Diagnosis performed by Tomasa Blase, MD.   Electronically signed 12/12/2022 9:42:07AM The electronic signature indicates that the named Attending Pathologist has evaluated the specimen Technical component performed at [facility], [address], [place], [place] [address] Lab: [phone] Dir: Rush Farmer, MD, MMM  Professional component performed at [facility], [facility], [place], [place], [place] [address] Lab: [phone] Dir: Kathi Simpers, MD       Assessment & Plan:   Problem List Items Addressed This Visit       Cardiovascular and Mediastinum   Hypertension - Primary   Other Visit Diagnoses     Elevated LDL cholesterol level            Follow up plan: No follow-ups on file.

## 2022-12-27 ENCOUNTER — Encounter: Payer: Self-pay | Admitting: Nurse Practitioner

## 2022-12-27 ENCOUNTER — Ambulatory Visit: Payer: 59 | Admitting: Nurse Practitioner

## 2022-12-27 VITALS — BP 127/80 | HR 66 | Temp 97.8°F | Wt 161.8 lb

## 2022-12-27 DIAGNOSIS — E7800 Pure hypercholesterolemia, unspecified: Secondary | ICD-10-CM

## 2022-12-27 DIAGNOSIS — I10 Essential (primary) hypertension: Secondary | ICD-10-CM

## 2022-12-27 MED ORDER — LISINOPRIL 40 MG PO TABS: 40.0000 mg | ORAL_TABLET | Freq: Every day | ORAL | 2 refills | Status: DC

## 2022-12-27 MED ORDER — AMLODIPINE BESYLATE 10 MG PO TABS: 10.0000 mg | ORAL_TABLET | Freq: Every day | ORAL | 2 refills | Status: DC

## 2022-12-27 NOTE — Assessment & Plan Note (Signed)
Chronic.  Controlled.  Continue with current medication regimen of Amlodipine and Lisinopril.  Refills sent today.  Labs ordered today.  Return to clinic in 7 months for reevaluation.  Call sooner if concerns arise.

## 2022-12-28 LAB — COMPREHENSIVE METABOLIC PANEL
ALT: 50 IU/L — ABNORMAL HIGH (ref 0–32)
AST: 40 IU/L (ref 0–40)
Albumin/Globulin Ratio: 1.7 (ref 1.2–2.2)
Albumin: 4.7 g/dL (ref 3.8–4.9)
Alkaline Phosphatase: 138 IU/L — ABNORMAL HIGH (ref 44–121)
BUN/Creatinine Ratio: 9 (ref 9–23)
BUN: 8 mg/dL (ref 6–24)
Bilirubin Total: 0.5 mg/dL (ref 0.0–1.2)
CO2: 15 mmol/L — ABNORMAL LOW (ref 20–29)
Calcium: 9.3 mg/dL (ref 8.7–10.2)
Chloride: 109 mmol/L — ABNORMAL HIGH (ref 96–106)
Creatinine, Ser: 0.89 mg/dL (ref 0.57–1.00)
Globulin, Total: 2.7 g/dL (ref 1.5–4.5)
Glucose: 87 mg/dL (ref 70–99)
Potassium: 5 mmol/L (ref 3.5–5.2)
Sodium: 146 mmol/L — ABNORMAL HIGH (ref 134–144)
Total Protein: 7.4 g/dL (ref 6.0–8.5)
eGFR: 77 mL/min/{1.73_m2} (ref >59)

## 2022-12-28 LAB — LIPID PANEL
Chol/HDL Ratio: 5.5 ratio — ABNORMAL HIGH (ref 0.0–4.4)
Cholesterol, Total: 181 mg/dL (ref 100–199)
HDL: 33 mg/dL — ABNORMAL LOW (ref >39)
LDL Chol Calc (NIH): 123 mg/dL — ABNORMAL HIGH (ref 0–99)
Triglycerides: 137 mg/dL (ref 0–149)
VLDL Cholesterol Cal: 25 mg/dL (ref 5–40)

## 2022-12-28 NOTE — Progress Notes (Signed)
Hi Sherry Bass. It was nice to see you yesterday.  Your lab work looks good.  Make sure you are drinking plenty of water.  Your liver enzymes are slightly elevated.  This is consistent with prior labs.  If they remain elevated at your next visit I will order an ultrasound of your liver.  I recommend a low fat diet and exercise due to your cholesterol being elevated.  No concerns at this time. Continue with your current medication regimen.  Follow up as discussed.  Please let me know if you have any questions.

## 2023-01-04 ENCOUNTER — Encounter: Payer: Self-pay | Admitting: Nurse Practitioner

## 2023-05-16 ENCOUNTER — Other Ambulatory Visit: Payer: Self-pay | Admitting: Family Medicine

## 2023-05-16 DIAGNOSIS — Z1231 Encounter for screening mammogram for malignant neoplasm of breast: Secondary | ICD-10-CM

## 2023-05-22 IMAGING — DX DG FOOT COMPLETE 3+V*R*
3 series · 3 of 3 positions shown · non-contrast
Comparison: None.

CLINICAL DATA: Right foot pain while exercising. Subsequent
intermittent dorsal foot soft tissue swelling.

EXAM:
RIGHT FOOT COMPLETE - 3+ VIEW

[foot ap]
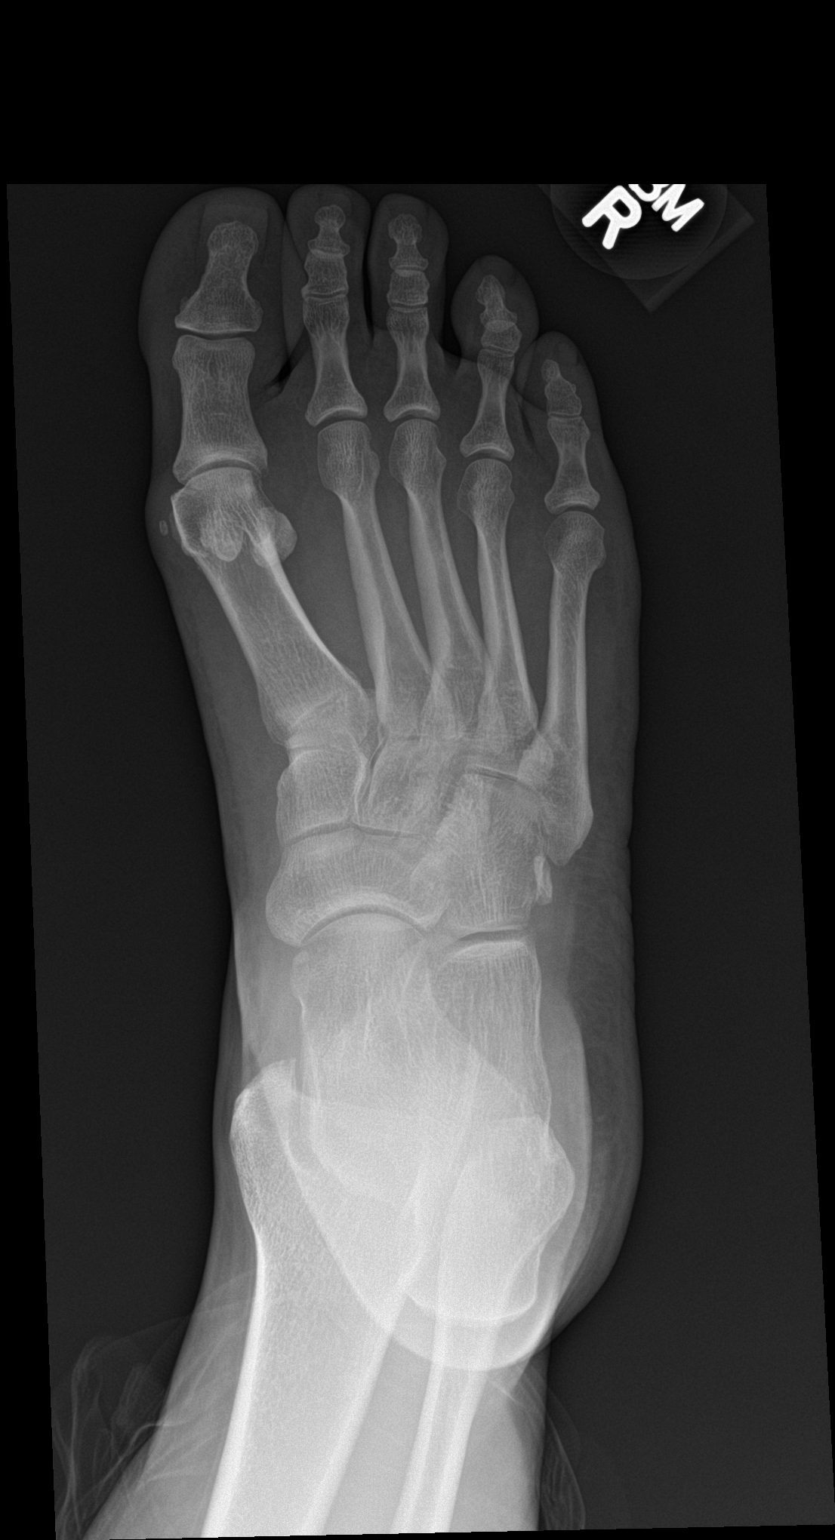

[foot obl]
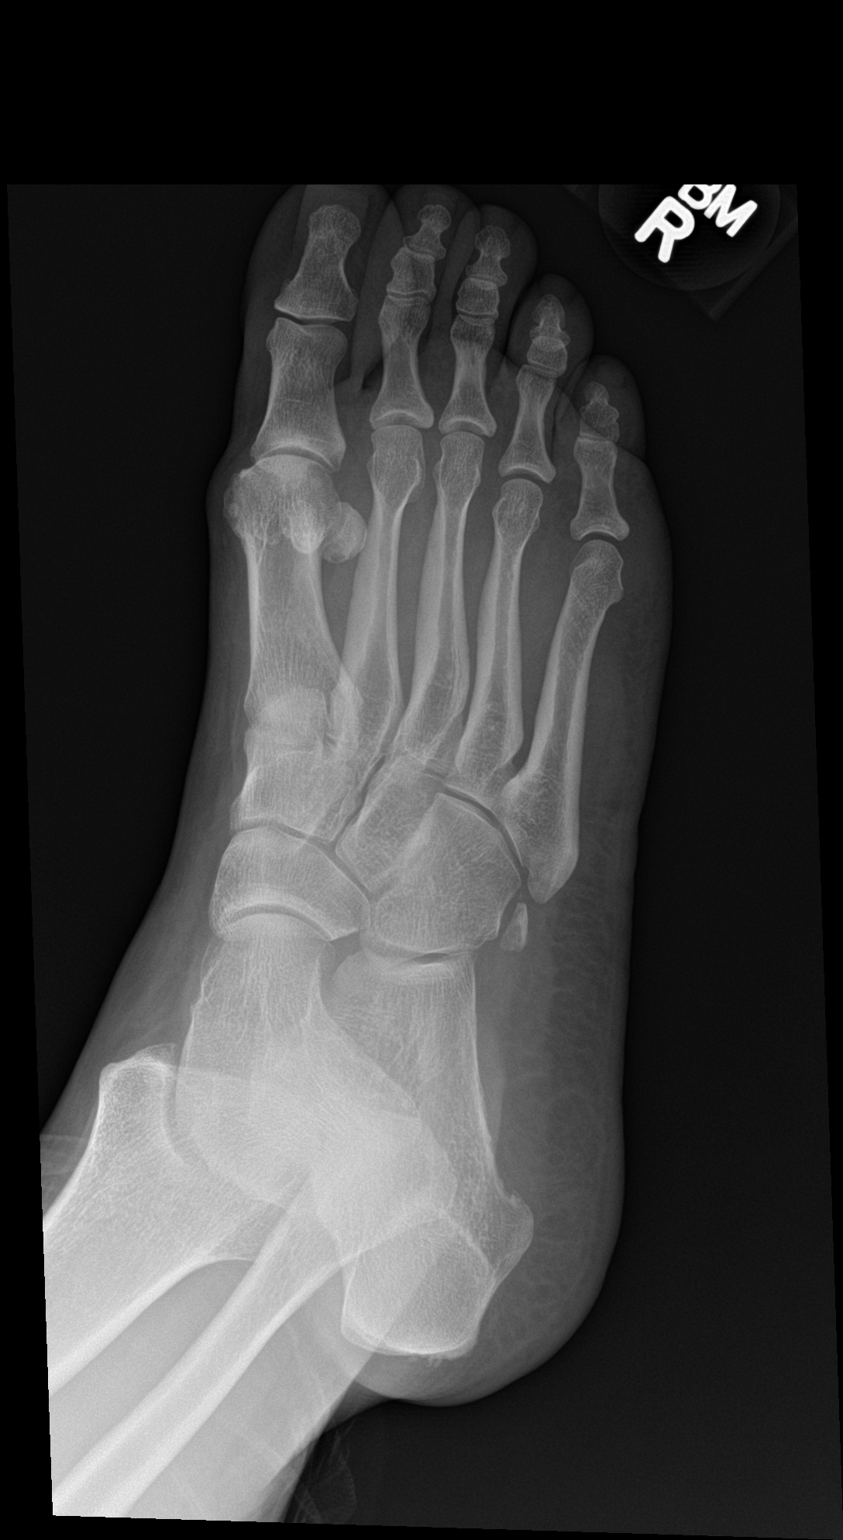

[foot lat]
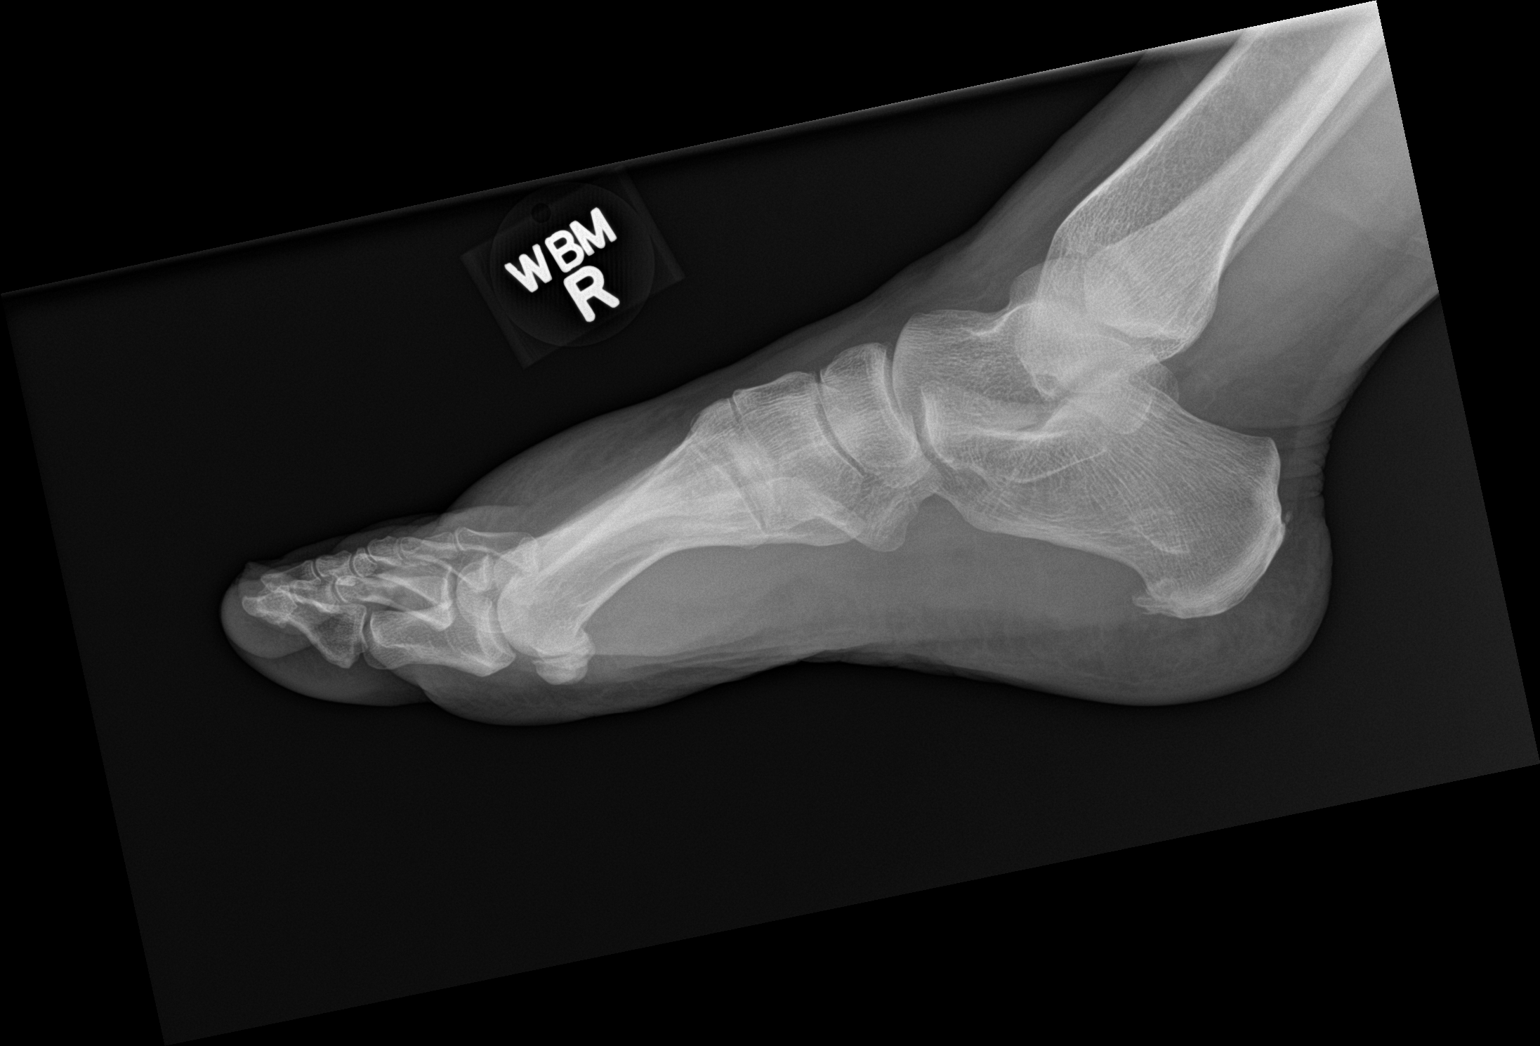

[3 of 3 positions shown; findings below may reference images not displayed]

FINDINGS: Dorsal soft tissue swelling distally. Moderate inferior and minimal
posterior calcaneal enthesophyte formation. No fracture or
dislocation seen. Mild hallux valgus.
IMPRESSION: 1. Dorsal soft tissue swelling distally without evidence of acute
underlying bony abnormality.
2. Mild hallux valgus.

## 2023-05-24 ENCOUNTER — Telehealth: Payer: Self-pay

## 2023-05-24 NOTE — Telephone Encounter (Signed)
Called and LVM asking for patient to please return my call.  

## 2023-05-24 NOTE — Telephone Encounter (Signed)
-----   Message from Olevia Perches sent at 05/16/2023  2:17 PM EDT ----- Please schedule mammo (last done at [facility] in 2019)

## 2023-07-30 NOTE — Progress Notes (Unsigned)
There were no vitals taken for this visit.   Subjective:    Patient ID: Sherry Bass, female    DOB: [birth year], 52 y.o.   MRN: [id number]  HPI: Sherry Bass is a 52 y.o. female presenting on 07/31/2023 for comprehensive medical examination. Current medical complaints include: leg swelling  She currently lives with: Menopausal Symptoms: no  HYPERTENSION with out Chronic Kidney Disease Hypertension status: controlled  Satisfied with current treatment? yes Duration of hypertension: years BP monitoring frequency:  daily BP range: 110/70 BP medication side effects:  no Medication compliance: excellent compliance Previous BP meds:amlodipine and lisinopril Aspirin: no Recurrent headaches: no Visual changes: no Palpitations: no Dyspnea: no Chest pain: no Lower extremity edema: no Dizzy/lightheaded: no  Depression Screen done today and results listed below:     12/27/2022    9:39 AM 06/28/2022   10:19 AM 03/24/2022   10:12 AM 02/17/2022   10:29 AM 01/13/2022   11:02 AM  Depression screen PHQ 2/9  Decreased Interest 0 1 1 1 1   Down, Depressed, Hopeless 0 1 1 0 1  PHQ - 2 Score 0 2 2 1 2   Altered sleeping 0 1 1 1 1   Tired, decreased energy 0 1 1 1 1   Change in appetite 0 1 1 1 1   Feeling bad or failure about yourself  0 1 1 0 1  Trouble concentrating 0 1 1 0 1  Moving slowly or fidgety/restless 0 0 0 0 0  Suicidal thoughts 0 0 0 0 0  PHQ-9 Score 0 7 7 4 7   Difficult doing work/chores Not difficult at all Not difficult at all Not difficult at all Not difficult at all Not difficult at all    The patient does not have a history of falls. I did complete a risk assessment for falls. A plan of care for falls was documented.   Past Medical History:  Past Medical History:  Diagnosis Date  . Achilles tendonitis   . Allergic rhinitis   . Allergy   . Hypertension   . Raynaud disease     Surgical History:  Past Surgical History:  Procedure Laterality Date   . CESAREAN SECTION     x 2  . COLONOSCOPY N/A 12/09/2022   Procedure: COLONOSCOPY;  Surgeon: Jaynie Collins, DO;  Location: [facility] ENDOSCOPY;  Service: Gastroenterology;  Laterality: N/A;  . DILATION AND CURETTAGE OF UTERUS      Medications:  Current Outpatient Medications on File Prior to Visit  Medication Sig  . amLODipine (NORVASC) 10 MG tablet Take 1 tablet (10 mg total) by mouth daily.  Marland Kitchen lisinopril (ZESTRIL) 40 MG tablet Take 1 tablet (40 mg total) by mouth daily.   No current facility-administered medications on file prior to visit.    Allergies:  Allergies  Allergen Reactions  . Prednisone Nausea And Vomiting    Social History:  Social History   Socioeconomic History  . Marital status: Married    Spouse name: Not on file  . Number of children: Not on file  . Years of education: Not on file  . Highest education level: Not on file  Occupational History  . Not on file  Tobacco Use  . Smoking status: Never  . Smokeless tobacco: Never  Vaping Use  . Vaping status: Never Used  Substance and Sexual Activity  . Alcohol use: No  . Drug use: No  . Sexual activity: Yes  Other Topics Concern  . Not on file  Social  History Narrative  . Not on file   Social Determinants of Health   Financial Resource Strain: Not on file  Food Insecurity: Not on file  Transportation Needs: Not on file  Physical Activity: Sufficiently Active (11/16/2017)   Exercise Vital Sign   . Days of Exercise per Week: 5 days   . Minutes of Exercise per Session: 60 min  Stress: No Stress Concern Present (11/16/2017)   [employer] of Occupational Health - Occupational Stress Questionnaire   . Feeling of Stress : Only a little  Social Connections: Somewhat Isolated (11/16/2017)   Social Connection and Isolation Panel [NHANES]   . Frequency of Communication with Friends and Family: Once a week   . Frequency of Social Gatherings with Friends and Family: Never   . Attends Religious Services:  More than 4 times per year   . Active Member of Clubs or Organizations: No   . Attends Banker Meetings: Never   . Marital Status: Married  Catering manager Violence: Not At Risk (11/16/2017)   Humiliation, Afraid, Rape, and Kick questionnaire   . Fear of Current or Ex-Partner: No   . Emotionally Abused: No   . Physically Abused: No   . Sexually Abused: No   Social History   Tobacco Use  Smoking Status Never  Smokeless Tobacco Never   Social History   Substance and Sexual Activity  Alcohol Use No    Family History:  Family History  Problem Relation Age of Onset  . Diabetes Mother   . Hypertension Mother   . Congestive Heart Failure Mother   . Stroke Father   . Hyperlipidemia Father   . Stroke Sister   . Stroke Maternal Grandfather   . Dementia Paternal Grandmother   . Heart disease Paternal Grandfather     Past medical history, surgical history, medications, allergies, family history and social history reviewed with patient today and changes made to appropriate areas of the chart.   Review of Systems  Eyes:  Negative for blurred vision and double vision.  Respiratory:  Negative for shortness of breath.   Cardiovascular:  Negative for chest pain, palpitations and leg swelling.  Neurological:  Negative for dizziness and headaches.  Psychiatric/Behavioral:  Positive for depression. Negative for suicidal ideas. The patient is nervous/anxious.    All other ROS negative except what is listed above and in the HPI.      Objective:    There were no vitals taken for this visit.  Wt Readings from Last 3 Encounters:  12/27/22 161 lb 12.8 oz (73.4 kg)  12/09/22 159 lb (72.1 kg)  06/28/22 160 lb 12.8 oz (72.9 kg)    Physical Exam Vitals and nursing note reviewed.  Constitutional:      General: She is awake. She is not in acute distress.    Appearance: Normal appearance. She is well-developed. She is obese. She is not ill-appearing.  HENT:     Head:  Normocephalic and atraumatic.     Right Ear: Hearing, tympanic membrane, ear canal and external ear normal. No drainage.     Left Ear: Hearing, tympanic membrane, ear canal and external ear normal. No drainage.     Nose: Nose normal.     Right Sinus: No maxillary sinus tenderness or frontal sinus tenderness.     Left Sinus: No maxillary sinus tenderness or frontal sinus tenderness.     Mouth/Throat:     Mouth: Mucous membranes are moist.     Pharynx: Oropharynx is clear. Uvula  midline. No pharyngeal swelling, oropharyngeal exudate or posterior oropharyngeal erythema.  Eyes:     General: Lids are normal.        Right eye: No discharge.        Left eye: No discharge.     Extraocular Movements: Extraocular movements intact.     Conjunctiva/sclera: Conjunctivae normal.     Pupils: Pupils are equal, round, and reactive to light.     Visual Fields: Right eye visual fields normal and left eye visual fields normal.  Neck:     Thyroid: No thyromegaly.     Vascular: No carotid bruit.     Trachea: Trachea normal.  Cardiovascular:     Rate and Rhythm: Normal rate and regular rhythm.     Heart sounds: Normal heart sounds. No murmur heard.    No gallop.  Pulmonary:     Effort: Pulmonary effort is normal. No accessory muscle usage or respiratory distress.     Breath sounds: Normal breath sounds.  Chest:  Breasts:    Right: Normal.     Left: Normal.  Abdominal:     General: Bowel sounds are normal.     Palpations: Abdomen is soft. There is no hepatomegaly or splenomegaly.     Tenderness: There is no abdominal tenderness.  Musculoskeletal:        General: Normal range of motion.     Cervical back: Normal range of motion and neck supple.     Right lower leg: No edema.     Left lower leg: No edema.  Lymphadenopathy:     Head:     Right side of head: No submental, submandibular, tonsillar, preauricular or posterior auricular adenopathy.     Left side of head: No submental, submandibular,  tonsillar, preauricular or posterior auricular adenopathy.     Cervical: No cervical adenopathy.     Upper Body:     Right upper body: No supraclavicular, axillary or pectoral adenopathy.     Left upper body: No supraclavicular, axillary or pectoral adenopathy.  Skin:    General: Skin is warm and dry.     Capillary Refill: Capillary refill takes less than 2 seconds.     Findings: No rash.  Neurological:     Mental Status: She is alert and oriented to person, place, and time.     Gait: Gait is intact.     Deep Tendon Reflexes: Reflexes are normal and symmetric.     Reflex Scores:      Brachioradialis reflexes are 2+ on the right side and 2+ on the left side.      Patellar reflexes are 2+ on the right side and 2+ on the left side. Psychiatric:        Attention and Perception: Attention normal.        Mood and Affect: Mood normal.        Speech: Speech normal.        Behavior: Behavior normal. Behavior is cooperative.        Thought Content: Thought content normal.        Judgment: Judgment normal.    Results for orders placed or performed in visit on 01/04/23  HM COLONOSCOPY  Result Value Ref Range   HM Colonoscopy See Report (in chart) See Report (in chart), Patient Reported      Assessment & Plan:   Problem List Items Addressed This Visit       Cardiovascular and Mediastinum   Hypertension   Other Visit Diagnoses  Annual physical exam    -  Primary   Elevated LDL cholesterol level            Follow up plan: No follow-ups on file.   LABORATORY TESTING:  - Pap smear:  will get at GYN  IMMUNIZATIONS:   - Tdap: Tetanus vaccination status reviewed: last tetanus booster within 10 years. - Influenza: Refused - Pneumovax: Not applicable - Prevnar: Not applicable - COVID: Not applicable - HPV: Not applicable - Shingrix vaccine:  Will get at later date  SCREENING: -Mammogram: Ordered today  - Colonoscopy: Ordered today  - Bone Density: Not applicable   -Hearing Test: Not applicable  -Spirometry: Not applicable   PATIENT COUNSELING:   Advised to take 1 mg of folate supplement per day if capable of pregnancy.   Sexuality: Discussed sexually transmitted diseases, partner selection, use of condoms, avoidance of unintended pregnancy  and contraceptive alternatives.   Advised to avoid cigarette smoking.  I discussed with the patient that most people either abstain from alcohol or drink within safe limits (<=14/week and <=4 drinks/occasion for males, <=7/weeks and <= 3 drinks/occasion for females) and that the risk for alcohol disorders and other health effects rises proportionally with the number of drinks per week and how often a drinker exceeds daily limits.  Discussed cessation/primary prevention of drug use and availability of treatment for abuse.   Diet: Encouraged to adjust caloric intake to maintain  or achieve ideal body weight, to reduce intake of dietary saturated fat and total fat, to limit sodium intake by avoiding high sodium foods and not adding table salt, and to maintain adequate dietary potassium and calcium preferably from fresh fruits, vegetables, and low-fat dairy products.    stressed the importance of regular exercise  Injury prevention: Discussed safety belts, safety helmets, smoke detector, smoking near bedding or upholstery.   Dental health: Discussed importance of regular tooth brushing, flossing, and dental visits.    NEXT PREVENTATIVE PHYSICAL DUE IN 1 YEAR. No follow-ups on file.

## 2023-07-31 ENCOUNTER — Encounter: Payer: Self-pay | Admitting: Nurse Practitioner

## 2023-07-31 ENCOUNTER — Ambulatory Visit (INDEPENDENT_AMBULATORY_CARE_PROVIDER_SITE_OTHER): Payer: 59 | Admitting: Nurse Practitioner

## 2023-07-31 VITALS — BP 126/79 | HR 64 | Temp 97.8°F | Ht 59.0 in | Wt 169.8 lb

## 2023-07-31 DIAGNOSIS — E7800 Pure hypercholesterolemia, unspecified: Secondary | ICD-10-CM

## 2023-07-31 DIAGNOSIS — Z0000 Encounter for general adult medical examination without abnormal findings: Secondary | ICD-10-CM | POA: Diagnosis not present

## 2023-07-31 DIAGNOSIS — I10 Essential (primary) hypertension: Secondary | ICD-10-CM | POA: Diagnosis not present

## 2023-07-31 DIAGNOSIS — Z1231 Encounter for screening mammogram for malignant neoplasm of breast: Secondary | ICD-10-CM | POA: Diagnosis not present

## 2023-07-31 LAB — URINALYSIS, ROUTINE W REFLEX MICROSCOPIC
Bilirubin, UA: NEGATIVE
Glucose, UA: NEGATIVE
Ketones, UA: NEGATIVE
Leukocytes,UA: NEGATIVE
Nitrite, UA: NEGATIVE
Protein,UA: NEGATIVE
RBC, UA: NEGATIVE
Specific Gravity, UA: 1.025 (ref 1.005–1.030)
Urobilinogen, Ur: 0.2 mg/dL (ref 0.2–1.0)
pH, UA: 5.5 (ref 5.0–7.5)

## 2023-07-31 MED ORDER — LISINOPRIL 40 MG PO TABS: 40.0000 mg | ORAL_TABLET | Freq: Every day | ORAL | 1 refills | Status: DC

## 2023-07-31 MED ORDER — AMLODIPINE BESYLATE 10 MG PO TABS: 10.0000 mg | ORAL_TABLET | Freq: Every day | ORAL | 1 refills | Status: DC

## 2023-07-31 NOTE — Assessment & Plan Note (Signed)
Chronic.  Controlled.  Continue with current medication regimen of Amlodipine 10mg  and Lisinopril 40mg .  Refills sent today.  Labs ordered today.  Return to clinic in 6 months for reevaluation.  Call sooner if concerns arise.

## 2023-08-01 LAB — CBC WITH DIFFERENTIAL/PLATELET
Basophils Absolute: 0.1 10*3/uL (ref 0.0–0.2)
Basos: 1 %
EOS (ABSOLUTE): 0.5 10*3/uL — ABNORMAL HIGH (ref 0.0–0.4)
Eos: 4 %
Hematocrit: 45.7 % (ref 34.0–46.6)
Hemoglobin: 14.7 g/dL (ref 11.1–15.9)
Immature Grans (Abs): 0.1 10*3/uL (ref 0.0–0.1)
Immature Granulocytes: 1 %
Lymphocytes Absolute: 3 10*3/uL (ref 0.7–3.1)
Lymphs: 26 %
MCH: 29.8 pg (ref 26.6–33.0)
MCHC: 32.2 g/dL (ref 31.5–35.7)
MCV: 93 fL (ref 79–97)
Monocytes Absolute: 0.6 10*3/uL (ref 0.1–0.9)
Monocytes: 6 %
Neutrophils Absolute: 7.3 10*3/uL — ABNORMAL HIGH (ref 1.4–7.0)
Neutrophils: 62 %
Platelets: 287 10*3/uL (ref 150–450)
RBC: 4.93 x10E6/uL (ref 3.77–5.28)
RDW: 13.1 % (ref 11.7–15.4)
WBC: 11.6 10*3/uL — ABNORMAL HIGH (ref 3.4–10.8)

## 2023-08-01 LAB — LIPID PANEL
Chol/HDL Ratio: 4.1 ratio (ref 0.0–4.4)
Cholesterol, Total: 181 mg/dL (ref 100–199)
HDL: 44 mg/dL (ref >39)
LDL Chol Calc (NIH): 110 mg/dL — ABNORMAL HIGH (ref 0–99)
Triglycerides: 152 mg/dL — ABNORMAL HIGH (ref 0–149)
VLDL Cholesterol Cal: 27 mg/dL (ref 5–40)

## 2023-08-01 LAB — COMPREHENSIVE METABOLIC PANEL
ALT: 44 [IU]/L — ABNORMAL HIGH (ref 0–32)
AST: 34 [IU]/L (ref 0–40)
Albumin: 4.4 g/dL (ref 3.8–4.9)
Alkaline Phosphatase: 137 [IU]/L — ABNORMAL HIGH (ref 44–121)
BUN/Creatinine Ratio: 14 (ref 9–23)
BUN: 11 mg/dL (ref 6–24)
Bilirubin Total: 0.3 mg/dL (ref 0.0–1.2)
CO2: 19 mmol/L — ABNORMAL LOW (ref 20–29)
Calcium: 9.2 mg/dL (ref 8.7–10.2)
Chloride: 101 mmol/L (ref 96–106)
Creatinine, Ser: 0.81 mg/dL (ref 0.57–1.00)
Globulin, Total: 2.8 g/dL (ref 1.5–4.5)
Glucose: 80 mg/dL (ref 70–99)
Potassium: 4.2 mmol/L (ref 3.5–5.2)
Sodium: 138 mmol/L (ref 134–144)
Total Protein: 7.2 g/dL (ref 6.0–8.5)
eGFR: 86 mL/min/{1.73_m2} (ref >59)

## 2023-08-01 LAB — TSH: TSH: 2.2 u[IU]/mL (ref 0.450–4.500)

## 2023-10-10 LAB — HM MAMMOGRAPHY

## 2023-10-25 ENCOUNTER — Ambulatory Visit (INDEPENDENT_AMBULATORY_CARE_PROVIDER_SITE_OTHER): Payer: 59 | Admitting: Nurse Practitioner

## 2023-10-25 ENCOUNTER — Encounter: Payer: Self-pay | Admitting: Nurse Practitioner

## 2023-10-25 VITALS — BP 134/84 | HR 78 | Temp 97.8°F | Ht 59.0 in | Wt 172.0 lb

## 2023-10-25 DIAGNOSIS — R748 Abnormal levels of other serum enzymes: Secondary | ICD-10-CM | POA: Diagnosis not present

## 2023-10-25 DIAGNOSIS — I10 Essential (primary) hypertension: Secondary | ICD-10-CM | POA: Diagnosis not present

## 2023-10-25 DIAGNOSIS — R631 Polydipsia: Secondary | ICD-10-CM | POA: Diagnosis not present

## 2023-10-25 LAB — BAYER DCA HB A1C WAIVED: HB A1C (BAYER DCA - WAIVED): 5.3 % (ref 4.8–5.6)

## 2023-10-25 MED ORDER — OLMESARTAN MEDOXOMIL 20 MG PO TABS: 20.0000 mg | ORAL_TABLET | Freq: Every day | ORAL | 0 refills | Status: DC

## 2023-10-25 NOTE — Progress Notes (Signed)
 BP 134/84 (BP Location: Left Arm, Patient Position: Sitting, Cuff Size: Large)   Pulse 78   Temp 97.8 F (36.6 C) (Oral)   Ht 4\' 11"  (1.499 m)   Wt 172 lb (78 kg)   SpO2 99%   BMI 34.74 kg/m    Subjective:    Patient ID: Sherry Bass, female    DOB: 1971-02-03, 53 y.o.   MRN: [id number]  HPI: Sherry Bass is a 53 y.o. female  Chief Complaint  Patient presents with   Medication Management    Lisinopril  and amlodipine  causes side effects    Patient states she has developed a dry cough.  It comes and goes.  She has developed a gritty feeling in her throat which has been aggravating her cough.  She has added a humidifier which has helped some with her symptoms.  Patient is having some gum swelling also.  Unsure if it is related to medication or not.     Relevant past medical, surgical, family and social history reviewed and updated as indicated. Interim medical history since our last visit reviewed. Allergies and medications reviewed and updated.  Review of Systems  HENT:         Gum swelling  Respiratory:  Positive for cough.     Per HPI unless specifically indicated above     Objective:    BP 134/84 (BP Location: Left Arm, Patient Position: Sitting, Cuff Size: Large)   Pulse 78   Temp 97.8 F (36.6 C) (Oral)   Ht 4\' 11"  (1.499 m)   Wt 172 lb (78 kg)   SpO2 99%   BMI 34.74 kg/m   Wt Readings from Last 3 Encounters:  10/25/23 172 lb (78 kg)  07/31/23 169 lb 12.8 oz (77 kg)  12/27/22 161 lb 12.8 oz (73.4 kg)    Physical Exam Vitals and nursing note reviewed.  Constitutional:      General: She is not in acute distress.    Appearance: Normal appearance. She is normal weight. She is not ill-appearing, toxic-appearing or diaphoretic.  HENT:     Head: Normocephalic.     Comments: Gum swelling between teeth    Right Ear: External ear normal.     Left Ear: External ear normal.     Nose: Nose normal.     Mouth/Throat:     Mouth: Mucous  membranes are moist.     Pharynx: Oropharynx is clear.  Eyes:     General:        Right eye: No discharge.        Left eye: No discharge.     Extraocular Movements: Extraocular movements intact.     Conjunctiva/sclera: Conjunctivae normal.     Pupils: Pupils are equal, round, and reactive to light.  Cardiovascular:     Rate and Rhythm: Normal rate and regular rhythm.     Heart sounds: No murmur heard. Pulmonary:     Effort: Pulmonary effort is normal. No respiratory distress.     Breath sounds: Normal breath sounds. No wheezing or rales.  Musculoskeletal:     Cervical back: Normal range of motion and neck supple.  Skin:    General: Skin is warm and dry.     Capillary Refill: Capillary refill takes less than 2 seconds.  Neurological:     General: No focal deficit present.     Mental Status: She is alert and oriented to person, place, and time. Mental status is at baseline.  Psychiatric:  Mood and Affect: Mood normal.        Behavior: Behavior normal.        Thought Content: Thought content normal.        Judgment: Judgment normal.     Results for orders placed or performed in visit on 10/12/23  HM MAMMOGRAPHY   Collection Time: 10/10/23  9:37 AM  Result Value Ref Range   HM Mammogram 0-4 Bi-Rad 0-4 Bi-Rad, Self Reported Normal      Assessment & Plan:   Problem List Items Addressed This Visit       Cardiovascular and Mediastinum   Hypertension - Primary   Chronic. Will change from Lisinopril  to Olmesartan .  If swelling persists, will decrease dose of amlodipine .  Follow up in 1 month.  Call sooner if concerns aries.       Relevant Medications   olmesartan  (BENICAR ) 20 MG tablet   Other Visit Diagnoses       Polydipsia       Low suspicion for diabetes. Will rule out with labs due to thirst that can't be quenched at times.   Relevant Orders   Comp Met (CMET)   Bayer DCA Hb A1c Waived        Follow up plan: Return in about 1 month (around 11/25/2023) for  BP Check.

## 2023-10-25 NOTE — Assessment & Plan Note (Signed)
 Chronic. Will change from Lisinopril  to Olmesartan .  If swelling persists, will decrease dose of amlodipine .  Follow up in 1 month.  Call sooner if concerns aries.

## 2023-10-26 LAB — COMPREHENSIVE METABOLIC PANEL
ALT: 33 [IU]/L — ABNORMAL HIGH (ref 0–32)
AST: 25 [IU]/L (ref 0–40)
Albumin: 4.8 g/dL (ref 3.8–4.9)
Alkaline Phosphatase: 131 [IU]/L — ABNORMAL HIGH (ref 44–121)
BUN/Creatinine Ratio: 15 (ref 9–23)
BUN: 11 mg/dL (ref 6–24)
Bilirubin Total: <0.2 mg/dL (ref 0.0–1.2)
CO2: 16 mmol/L — ABNORMAL LOW (ref 20–29)
Calcium: 9.3 mg/dL (ref 8.7–10.2)
Chloride: 107 mmol/L — ABNORMAL HIGH (ref 96–106)
Creatinine, Ser: 0.74 mg/dL (ref 0.57–1.00)
Globulin, Total: 2.5 g/dL (ref 1.5–4.5)
Glucose: 134 mg/dL — ABNORMAL HIGH (ref 70–99)
Potassium: 4.2 mmol/L (ref 3.5–5.2)
Sodium: 142 mmol/L (ref 134–144)
Total Protein: 7.3 g/dL (ref 6.0–8.5)
eGFR: 96 mL/min/{1.73_m2} (ref >59)

## 2023-10-26 NOTE — Addendum Note (Signed)
Addended by: Larae Grooms on: 10/26/2023 09:20 AM   Modules accepted: Orders

## 2023-11-27 ENCOUNTER — Encounter: Payer: Self-pay | Admitting: Nurse Practitioner

## 2023-11-29 ENCOUNTER — Telehealth (INDEPENDENT_AMBULATORY_CARE_PROVIDER_SITE_OTHER): Payer: 59 | Admitting: Nurse Practitioner

## 2023-11-29 ENCOUNTER — Encounter: Payer: Self-pay | Admitting: Nurse Practitioner

## 2023-11-29 VITALS — BP 112/75 | HR 87 | Ht 58.5 in | Wt 158.0 lb

## 2023-11-29 DIAGNOSIS — I10 Essential (primary) hypertension: Secondary | ICD-10-CM | POA: Diagnosis not present

## 2023-11-29 MED ORDER — AMLODIPINE BESYLATE 5 MG PO TABS: 5.0000 mg | ORAL_TABLET | Freq: Every day | ORAL | 1 refills | Status: DC

## 2023-11-29 NOTE — Assessment & Plan Note (Signed)
Chronic.  Controlled.  Continue with current medication regimen of Olmesartan and Amlodipine.  However, patient is having swelling in her gums- will decrease Amlodipine to 5mg  daily.  Follow up in 1 month.  Call sooner if concerns arise.

## 2023-11-29 NOTE — Progress Notes (Signed)
BP 112/75 (BP Location: Left Arm, Patient Position: Sitting, Cuff Size: Large) Comment: home b/p  Pulse 87   Ht 4' 10.5" (1.486 m)   Wt 158 lb (71.7 kg)   LMP 11/20/2023   BMI 32.46 kg/m    Subjective:    Patient ID: Sherry Bass, female    DOB: December 05, 1970, 53 y.o.   MRN: [id number]  HPI: Sherry Bass is a 53 y.o. female  Chief Complaint  Patient presents with   Follow-up    1 month f/u b/p check-- readings highest 134/84, lowest-114/80.   HYPERTENSION without Chronic Kidney Disease Doing well with Olmesartan.  Her dry cough is gone.  She is still  Hypertension status: controlled  Satisfied with current treatment? yes Duration of hypertension: years BP monitoring frequency:  daily BP range: 112/80 BP medication side effects:  no Medication compliance: excellent compliance Previous BP meds:amlodipine and olmesartan (benicar) Aspirin: no Recurrent headaches: no Visual changes: no Palpitations: no Dyspnea: no Chest pain: no Lower extremity edema: no Dizzy/lightheaded: no  Relevant past medical, surgical, family and social history reviewed and updated as indicated. Interim medical history since our last visit reviewed. Allergies and medications reviewed and updated.  Review of Systems  Eyes:  Negative for visual disturbance.  Respiratory:  Negative for cough, chest tightness and shortness of breath.   Cardiovascular:  Negative for chest pain, palpitations and leg swelling.  Neurological:  Negative for dizziness and headaches.    Per HPI unless specifically indicated above     Objective:    BP 112/75 (BP Location: Left Arm, Patient Position: Sitting, Cuff Size: Large) Comment: home b/p  Pulse 87   Ht 4' 10.5" (1.486 m)   Wt 158 lb (71.7 kg)   LMP 11/20/2023   BMI 32.46 kg/m   Wt Readings from Last 3 Encounters:  11/29/23 158 lb (71.7 kg)  10/25/23 172 lb (78 kg)  07/31/23 169 lb 12.8 oz (77 kg)    Physical Exam Vitals and nursing note  reviewed.  HENT:     Head: Normocephalic.     Right Ear: Hearing normal.     Left Ear: Hearing normal.     Nose: Nose normal.  Eyes:     Pupils: Pupils are equal, round, and reactive to light.  Pulmonary:     Effort: Pulmonary effort is normal. No respiratory distress.  Neurological:     Mental Status: She is alert.  Psychiatric:        Mood and Affect: Mood normal.        Behavior: Behavior normal.        Thought Content: Thought content normal.        Judgment: Judgment normal.     Results for orders placed or performed in visit on 10/25/23  Comp Met (CMET)   Collection Time: 10/25/23  2:32 PM  Result Value Ref Range   Glucose 134 (H) 70 - 99 mg/dL   BUN 11 6 - 24 mg/dL   Creatinine, Ser 7.84 0.57 - 1.00 mg/dL   eGFR 96 >69 GE/XBM/8.41   BUN/Creatinine Ratio 15 9 - 23   Sodium 142 134 - 144 mmol/L   Potassium 4.2 3.5 - 5.2 mmol/L   Chloride 107 (H) 96 - 106 mmol/L   CO2 16 (L) 20 - 29 mmol/L   Calcium 9.3 8.7 - 10.2 mg/dL   Total Protein 7.3 6.0 - 8.5 g/dL   Albumin 4.8 3.8 - 4.9 g/dL   Globulin, Total 2.5 1.5 -  4.5 g/dL   Bilirubin Total <1.6 0.0 - 1.2 mg/dL   Alkaline Phosphatase 131 (H) 44 - 121 IU/L   AST 25 0 - 40 IU/L   ALT 33 (H) 0 - 32 IU/L  Bayer DCA Hb A1c Waived   Collection Time: 10/25/23  2:43 PM  Result Value Ref Range   HB A1C (BAYER DCA - WAIVED) 5.3 4.8 - 5.6 %      Assessment & Plan:   Problem List Items Addressed This Visit       Cardiovascular and Mediastinum   Hypertension - Primary   Chronic.  Controlled.  Continue with current medication regimen of Olmesartan and Amlodipine.  However, patient is having swelling in her gums- will decrease Amlodipine to 5mg  daily.  Follow up in 1 month.  Call sooner if concerns arise.        Relevant Medications   amLODipine (NORVASC) 5 MG tablet     Follow up plan: Return in about 1 month (around 12/27/2023) for BP Check (virtual).   This visit was completed via MyChart due to the restrictions  of the COVID-19 pandemic. All issues as above were discussed and addressed. Physical exam was done as above through visual confirmation on MyChart. If it was felt that the patient should be evaluated in the office, they were directed there. The patient verbally consented to this visit. Location of the patient: Home Location of the provider: Office Those involved with this call:  Provider: Larae Grooms, NP CMA: Irene Pap, CMA Front Desk/Registration: Servando Snare This encounter was conducted via video.  I spent 30 dedicated to the care of this patient on the date of this encounter to include previsit review of symptoms, plan of care and follow up, face to face time with the patient, and post visit ordering of testing.

## 2023-12-04 LAB — HM PAP SMEAR: HM Pap smear: NEGATIVE

## 2023-12-27 ENCOUNTER — Telehealth: Payer: Self-pay

## 2023-12-27 NOTE — Telephone Encounter (Signed)
 Spoke to patient and advised she has a prescription already on file with the pharmacy. Verified the pharmacy does in fact have the prescription and is going to fill for tomorrow.

## 2023-12-27 NOTE — Telephone Encounter (Signed)
 Copied from CRM [id number]. Topic: Clinical - Prescription Issue >> Dec 27, 2023  1:16 PM Victorino Dike T wrote: Reason for CRM: amLODipine (NORVASC) 5 MG tablet - will be out of this medication before appt, not sure if doctor wants to refill, it is working- please call [phone]

## 2024-01-01 NOTE — Progress Notes (Deleted)
 There were no vitals taken for this visit.   Subjective:    Patient ID: Sherry Bass, female    DOB: [birth year], 53 y.o.   MRN: [id number]  HPI: Sherry Bass is a 53 y.o. female  No chief complaint on file.  HYPERTENSION without Chronic Kidney Disease Doing well with Olmesartan.  Her dry cough is gone.  She is still  Hypertension status: controlled  Satisfied with current treatment? yes Duration of hypertension: years BP monitoring frequency:  daily BP range: 112/80 BP medication side effects:  no Medication compliance: excellent compliance Previous BP meds:amlodipine and olmesartan (benicar) Aspirin: no Recurrent headaches: no Visual changes: no Palpitations: no Dyspnea: no Chest pain: no Lower extremity edema: no Dizzy/lightheaded: no  Relevant past medical, surgical, family and social history reviewed and updated as indicated. Interim medical history since our last visit reviewed. Allergies and medications reviewed and updated.  Review of Systems  Eyes:  Negative for visual disturbance.  Respiratory:  Negative for cough, chest tightness and shortness of breath.   Cardiovascular:  Negative for chest pain, palpitations and leg swelling.  Neurological:  Negative for dizziness and headaches.    Per HPI unless specifically indicated above     Objective:    There were no vitals taken for this visit.  Wt Readings from Last 3 Encounters:  11/29/23 158 lb (71.7 kg)  10/25/23 172 lb (78 kg)  07/31/23 169 lb 12.8 oz (77 kg)    Physical Exam Vitals and nursing note reviewed.  HENT:     Head: Normocephalic.     Right Ear: Hearing normal.     Left Ear: Hearing normal.     Nose: Nose normal.  Eyes:     Pupils: Pupils are equal, round, and reactive to light.  Pulmonary:     Effort: Pulmonary effort is normal. No respiratory distress.  Neurological:     Mental Status: She is alert.  Psychiatric:        Mood and Affect: Mood normal.         Behavior: Behavior normal.        Thought Content: Thought content normal.        Judgment: Judgment normal.    Results for orders placed or performed in visit on 10/25/23  Comp Met (CMET)   Collection Time: 10/25/23  2:32 PM  Result Value Ref Range   Glucose 134 (H) 70 - 99 mg/dL   BUN 11 6 - 24 mg/dL   Creatinine, Ser 7.82 0.57 - 1.00 mg/dL   eGFR 96 >95 AO/ZHY/8.65   BUN/Creatinine Ratio 15 9 - 23   Sodium 142 134 - 144 mmol/L   Potassium 4.2 3.5 - 5.2 mmol/L   Chloride 107 (H) 96 - 106 mmol/L   CO2 16 (L) 20 - 29 mmol/L   Calcium 9.3 8.7 - 10.2 mg/dL   Total Protein 7.3 6.0 - 8.5 g/dL   Albumin 4.8 3.8 - 4.9 g/dL   Globulin, Total 2.5 1.5 - 4.5 g/dL   Bilirubin Total <7.8 0.0 - 1.2 mg/dL   Alkaline Phosphatase 131 (H) 44 - 121 IU/L   AST 25 0 - 40 IU/L   ALT 33 (H) 0 - 32 IU/L  Bayer DCA Hb A1c Waived   Collection Time: 10/25/23  2:43 PM  Result Value Ref Range   HB A1C (BAYER DCA - WAIVED) 5.3 4.8 - 5.6 %      Assessment & Plan:   Problem List Items Addressed This Visit  Cardiovascular and Mediastinum   Hypertension - Primary     Follow up plan: No follow-ups on file.   This visit was completed via MyChart due to the restrictions of the COVID-19 pandemic. All issues as above were discussed and addressed. Physical exam was done as above through visual confirmation on MyChart. If it was felt that the patient should be evaluated in the office, they were directed there. The patient verbally consented to this visit. Location of the patient: Home Location of the provider: Office Those involved with this call:  Provider: Larae Grooms, NP CMA: Irene Pap, CMA Front Desk/Registration: Servando Snare This encounter was conducted via video.  I spent 30 dedicated to the care of this patient on the date of this encounter to include previsit review of symptoms, plan of care and follow up, face to face time with the patient, and post visit ordering of testing.

## 2024-01-02 ENCOUNTER — Telehealth: Payer: 59 | Admitting: Nurse Practitioner

## 2024-01-02 DIAGNOSIS — I10 Essential (primary) hypertension: Secondary | ICD-10-CM

## 2024-01-09 ENCOUNTER — Ambulatory Visit: Admitting: Nurse Practitioner

## 2024-01-09 ENCOUNTER — Encounter: Payer: Self-pay | Admitting: Nurse Practitioner

## 2024-01-09 VITALS — BP 101/59 | HR 70 | Temp 98.6°F | Resp 15 | Ht 58.5 in | Wt 172.4 lb

## 2024-01-09 DIAGNOSIS — I10 Essential (primary) hypertension: Secondary | ICD-10-CM

## 2024-01-09 MED ORDER — AMLODIPINE BESYLATE 5 MG PO TABS: 5.0000 mg | ORAL_TABLET | Freq: Every day | ORAL | 1 refills | Status: DC

## 2024-01-09 MED ORDER — OLMESARTAN MEDOXOMIL 20 MG PO TABS: 20.0000 mg | ORAL_TABLET | Freq: Every day | ORAL | 1 refills | Status: DC

## 2024-01-09 NOTE — Progress Notes (Signed)
 BP (!) 101/59 (BP Location: Left Arm, Patient Position: Sitting, Cuff Size: Large)   Pulse 70   Temp 98.6 F (37 C) (Oral)   Resp 15   Ht 4' 10.5" (1.486 m)   Wt 172 lb 6.4 oz (78.2 kg)   LMP 11/22/2023 (Approximate)   SpO2 98%   BMI 35.41 kg/m    Subjective:    Patient ID: Sherry Bass, female    DOB: [birth year], 53 y.o.   MRN: [id number]  HPI: Sherry Bass is a 53 y.o. female  Chief Complaint  Patient presents with   Hypertension    Seems to be fine.    HYPERTENSION without Chronic Kidney Disease Doing well with Olmesartan.  Side effects of medications have resolved.  No more gum swelling and leg swelling. Hypertension status: controlled  Satisfied with current treatment? yes Duration of hypertension: years BP monitoring frequency:  daily BP range: 112/80 BP medication side effects:  no Medication compliance: excellent compliance Previous BP meds:amlodipine and olmesartan (benicar) Aspirin: no Recurrent headaches: no Visual changes: no Palpitations: no Dyspnea: no Chest pain: no Lower extremity edema: no Dizzy/lightheaded: no  Relevant past medical, surgical, family and social history reviewed and updated as indicated. Interim medical history since our last visit reviewed. Allergies and medications reviewed and updated.  Review of Systems  Eyes:  Negative for visual disturbance.  Respiratory:  Negative for cough, chest tightness and shortness of breath.   Cardiovascular:  Negative for chest pain, palpitations and leg swelling.  Neurological:  Negative for dizziness and headaches.    Per HPI unless specifically indicated above     Objective:    BP (!) 101/59 (BP Location: Left Arm, Patient Position: Sitting, Cuff Size: Large)   Pulse 70   Temp 98.6 F (37 C) (Oral)   Resp 15   Ht 4' 10.5" (1.486 m)   Wt 172 lb 6.4 oz (78.2 kg)   LMP 11/22/2023 (Approximate)   SpO2 98%   BMI 35.41 kg/m   Wt Readings from Last 3 Encounters:   01/09/24 172 lb 6.4 oz (78.2 kg)  11/29/23 158 lb (71.7 kg)  10/25/23 172 lb (78 kg)    Physical Exam Vitals and nursing note reviewed.  HENT:     Head: Normocephalic.     Right Ear: Hearing normal.     Left Ear: Hearing normal.     Nose: Nose normal.  Eyes:     Pupils: Pupils are equal, round, and reactive to light.  Pulmonary:     Effort: Pulmonary effort is normal. No respiratory distress.  Neurological:     Mental Status: She is alert.  Psychiatric:        Mood and Affect: Mood normal.        Behavior: Behavior normal.        Thought Content: Thought content normal.        Judgment: Judgment normal.     Results for orders placed or performed in visit on 10/25/23  Comp Met (CMET)   Collection Time: 10/25/23  2:32 PM  Result Value Ref Range   Glucose 134 (H) 70 - 99 mg/dL   BUN 11 6 - 24 mg/dL   Creatinine, Ser 7.82 0.57 - 1.00 mg/dL   eGFR 96 >95 AO/ZHY/8.65   BUN/Creatinine Ratio 15 9 - 23   Sodium 142 134 - 144 mmol/L   Potassium 4.2 3.5 - 5.2 mmol/L   Chloride 107 (H) 96 - 106 mmol/L   CO2 16 (L)  20 - 29 mmol/L   Calcium 9.3 8.7 - 10.2 mg/dL   Total Protein 7.3 6.0 - 8.5 g/dL   Albumin 4.8 3.8 - 4.9 g/dL   Globulin, Total 2.5 1.5 - 4.5 g/dL   Bilirubin Total <1.6 0.0 - 1.2 mg/dL   Alkaline Phosphatase 131 (H) 44 - 121 IU/L   AST 25 0 - 40 IU/L   ALT 33 (H) 0 - 32 IU/L  Bayer DCA Hb A1c Waived   Collection Time: 10/25/23  2:43 PM  Result Value Ref Range   HB A1C (BAYER DCA - WAIVED) 5.3 4.8 - 5.6 %      Assessment & Plan:   Problem List Items Addressed This Visit       Cardiovascular and Mediastinum   Hypertension - Primary   Chronic.  Controlled.  Continue with current medication regimen of Olmesartan and Amlodipine.  Refills sent today.  Labs ordered today.  Return to clinic in 6 months for reevaluation.  Call sooner if concerns arise.        Relevant Medications   amLODipine (NORVASC) 5 MG tablet   olmesartan (BENICAR) 20 MG tablet   Other  Relevant Orders   Comp Met (CMET)     Follow up plan: Return in about 6 months (around 07/10/2024) for HTN, HLD, DM2 FU.

## 2024-01-09 NOTE — Assessment & Plan Note (Signed)
 Chronic.  Controlled.  Continue with current medication regimen of Olmesartan and Amlodipine.  Refills sent today.  Labs ordered today.  Return to clinic in 6 months for reevaluation.  Call sooner if concerns arise.

## 2024-01-10 ENCOUNTER — Encounter: Payer: Self-pay | Admitting: Nurse Practitioner

## 2024-01-10 LAB — COMPREHENSIVE METABOLIC PANEL WITH GFR
ALT: 41 IU/L — ABNORMAL HIGH (ref 0–32)
AST: 29 IU/L (ref 0–40)
Albumin: 4.6 g/dL (ref 3.8–4.9)
Alkaline Phosphatase: 130 IU/L — ABNORMAL HIGH (ref 44–121)
BUN/Creatinine Ratio: 10 (ref 9–23)
BUN: 9 mg/dL (ref 6–24)
Bilirubin Total: 0.4 mg/dL (ref 0.0–1.2)
CO2: 19 mmol/L — ABNORMAL LOW (ref 20–29)
Calcium: 9.8 mg/dL (ref 8.7–10.2)
Chloride: 106 mmol/L (ref 96–106)
Creatinine, Ser: 0.91 mg/dL (ref 0.57–1.00)
Globulin, Total: 2.5 g/dL (ref 1.5–4.5)
Glucose: 85 mg/dL (ref 70–99)
Potassium: 4.5 mmol/L (ref 3.5–5.2)
Sodium: 140 mmol/L (ref 134–144)
Total Protein: 7.1 g/dL (ref 6.0–8.5)
eGFR: 75 mL/min/{1.73_m2} (ref >59)

## 2024-01-29 ENCOUNTER — Other Ambulatory Visit: Payer: Self-pay | Admitting: Nurse Practitioner

## 2024-01-29 ENCOUNTER — Ambulatory Visit: Payer: Self-pay | Admitting: Nurse Practitioner

## 2024-01-30 NOTE — Telephone Encounter (Signed)
 Requested by interface surescripts. Receipt confirmed by pharmacy 01/09/24 at 10:52 am .  Requested Prescriptions  Refused Prescriptions Disp Refills   olmesartan  (BENICAR ) 20 MG tablet [Pharmacy Med Name: OLMESARTAN  MEDOXOMIL 20MG  TABLETS] 90 tablet 1    Sig: TAKE 1 TABLET(20 MG) BY MOUTH DAILY     Cardiovascular:  Angiotensin Receptor Blockers Passed - 01/30/2024  4:20 PM      Passed - Cr in normal range and within 180 days    Creatinine, Ser  Date Value Ref Range Status  01/09/2024 0.91 0.57 - 1.00 mg/dL Final         Passed - K in normal range and within 180 days    Potassium  Date Value Ref Range Status  01/09/2024 4.5 3.5 - 5.2 mmol/L Final         Passed - Patient is not pregnant      Passed - Last BP in normal range    BP Readings from Last 1 Encounters:  01/09/24 (!) 101/59         Passed - Valid encounter within last 6 months    Recent Outpatient Visits           3 weeks ago Primary hypertension   [place] [facility] Aileen Alexanders, NP   2 months ago Primary hypertension   [place] [facility] Aileen Alexanders, NP

## 2024-07-10 ENCOUNTER — Ambulatory Visit: Admitting: Nurse Practitioner

## 2024-08-07 ENCOUNTER — Ambulatory Visit: Admitting: Nurse Practitioner

## 2024-08-07 ENCOUNTER — Other Ambulatory Visit: Payer: Self-pay | Admitting: Nurse Practitioner

## 2024-08-08 NOTE — Telephone Encounter (Signed)
 Requested Prescriptions  Pending Prescriptions Disp Refills   olmesartan  (BENICAR ) 20 MG tablet [Pharmacy Med Name: OLMESARTAN  MEDOXOMIL 20MG  TABLETS] 90 tablet 0    Sig: TAKE 1 TABLET(20 MG) BY MOUTH DAILY     Cardiovascular:  Angiotensin Receptor Blockers Failed - 08/08/2024  4:02 PM      Failed - Cr in normal range and within 180 days    Creatinine, Ser  Date Value Ref Range Status  01/09/2024 0.91 0.57 - 1.00 mg/dL Final         Failed - K in normal range and within 180 days    Potassium  Date Value Ref Range Status  01/09/2024 4.5 3.5 - 5.2 mmol/L Final         Failed - Valid encounter within last 6 months    Recent Outpatient Visits           7 months ago Primary hypertension   [place] [facility] Melvin Pao, NP   8 months ago Primary hypertension   [place] [facility] Melvin Pao, NP              Passed - Patient is not pregnant      Passed - Last BP in normal range    BP Readings from Last 1 Encounters:  01/09/24 (!) 101/59

## 2024-08-22 ENCOUNTER — Ambulatory Visit: Admitting: Nurse Practitioner

## 2024-09-23 ENCOUNTER — Encounter: Payer: Self-pay | Admitting: Nurse Practitioner

## 2024-09-23 ENCOUNTER — Ambulatory Visit: Admitting: Nurse Practitioner

## 2024-09-23 VITALS — BP 135/80 | HR 73 | Temp 98.0°F | Ht 58.5 in | Wt 177.2 lb

## 2024-09-23 DIAGNOSIS — I10 Essential (primary) hypertension: Secondary | ICD-10-CM | POA: Diagnosis not present

## 2024-09-23 DIAGNOSIS — Z0000 Encounter for general adult medical examination without abnormal findings: Secondary | ICD-10-CM | POA: Diagnosis not present

## 2024-09-23 DIAGNOSIS — E7800 Pure hypercholesterolemia, unspecified: Secondary | ICD-10-CM | POA: Insufficient documentation

## 2024-09-23 DIAGNOSIS — Z1231 Encounter for screening mammogram for malignant neoplasm of breast: Secondary | ICD-10-CM

## 2024-09-23 DIAGNOSIS — R7309 Other abnormal glucose: Secondary | ICD-10-CM

## 2024-09-23 DIAGNOSIS — R748 Abnormal levels of other serum enzymes: Secondary | ICD-10-CM

## 2024-09-23 MED ORDER — OLMESARTAN MEDOXOMIL 20 MG PO TABS: 20.0000 mg | ORAL_TABLET | Freq: Every day | ORAL | 1 refills | Status: AC

## 2024-09-23 MED ORDER — AMLODIPINE BESYLATE 5 MG PO TABS: 5.0000 mg | ORAL_TABLET | Freq: Every day | ORAL | 1 refills | Status: AC

## 2024-09-23 NOTE — Progress Notes (Signed)
 BP 135/80 (BP Location: Right Arm, Patient Position: Sitting, Cuff Size: Large)   Pulse 73   Temp 98 F (36.7 C) (Oral)   Ht 4' 10.5 (1.486 m)   Wt 177 lb 3.2 oz (80.4 kg)   LMP 07/10/2024 (Approximate)   SpO2 99%   BMI 36.40 kg/m    Subjective:    Patient ID: Sherry Bass, female    DOB: [birth year], 53 y.o.   MRN: [id number]  HPI: Sherry Bass is a 53 y.o. female presenting on 09/23/2024 for comprehensive medical examination. Current medical complaints include: none  She currently lives with: Menopausal Symptoms: no  HYPERTENSION with out Chronic Kidney Disease Hypertension status: controlled  Satisfied with current treatment? yes Duration of hypertension: years BP monitoring frequency:  not checking BP range:  BP medication side effects:  no Medication compliance: excellent compliance Previous BP meds: Amlodipine  and Olmesartan   Aspirin: no Recurrent headaches: no Visual changes: no Palpitations: no Dyspnea: no Chest pain: no Lower extremity edema: no Dizzy/lightheaded: no    Depression Screen done today and results listed below:     09/23/2024    3:53 PM 09/23/2024    3:42 PM 01/09/2024   10:22 AM 11/29/2023   10:17 AM 10/25/2023    2:04 PM  Depression screen PHQ 2/9  Decreased Interest 0 3 0 0 0  Down, Depressed, Hopeless 0 3 0 0 0  PHQ - 2 Score 0 6 0 0 0  Altered sleeping 0 3  0 0  Tired, decreased energy 0 3  0 0  Change in appetite 0 3  0 0  Feeling bad or failure about yourself  0 3  0 0  Trouble concentrating 0 3  0 0  Moving slowly or fidgety/restless 0 3  0 0  Suicidal thoughts 0 3  0 0  PHQ-9 Score 0 27  0  0   Difficult doing work/chores Not difficult at all  Not difficult at all Not difficult at all      Data saved with a previous flowsheet row definition    The patient does not have a history of falls. I did complete a risk assessment for falls. A plan of care for falls was documented.   Past Medical History:  Past  Medical History:  Diagnosis Date   Achilles tendonitis    Allergic rhinitis    Allergy    Hypertension    Raynaud disease     Surgical History:  Past Surgical History:  Procedure Laterality Date   CESAREAN SECTION     x 2   COLONOSCOPY N/A 12/09/2022   Procedure: COLONOSCOPY;  Surgeon: Onita Elspeth Sharper, DO;  Location: [facility] ENDOSCOPY;  Service: Gastroenterology;  Laterality: N/A;   DILATION AND CURETTAGE OF UTERUS      Medications:  No current outpatient medications on file prior to visit.   No current facility-administered medications on file prior to visit.    Allergies:  Allergies  Allergen Reactions   Lisinopril  Cough   Prednisone Nausea And Vomiting    Social History:  Social History   Socioeconomic History   Marital status: Married    Spouse name: Not on file   Number of children: Not on file   Years of education: Not on file   Highest education level: Not on file  Occupational History   Not on file  Tobacco Use   Smoking status: Never   Smokeless tobacco: Never  Vaping Use   Vaping status: Never Used  Substance and Sexual Activity   Alcohol use: No   Drug use: No   Sexual activity: Yes  Other Topics Concern   Not on file  Social History Narrative   Not on file   Social Drivers of Health   Tobacco Use: Low Risk (09/23/2024)   Patient History    Smoking Tobacco Use: Never    Smokeless Tobacco Use: Never    Passive Exposure: Not on file  Financial Resource Strain: Low Risk  (12/04/2023)   Received from [facility]   Overall Financial Resource Strain (CARDIA)    Difficulty of Paying Living Expenses: Not hard at all  Food Insecurity: No Food Insecurity (12/04/2023)   Received from [facility]   Epic    Within the past 12 months, you worried that your food would run out before you got the money to buy more.: Never true    Within the past 12 months, the food you bought just didn't last and you didn't have  money to get more.: Never true  Transportation Needs: No Transportation Needs (12/04/2023)   Received from [facility] - Transportation    In the past 12 months, has lack of transportation kept you from medical appointments or from getting medications?: No    Lack of Transportation (Non-Medical): No  Physical Activity: Sufficiently Active (07/31/2023)   Exercise Vital Sign    Days of Exercise per Week: 3 days    Minutes of Exercise per Session: 60 min  Stress: Not on file  Social Connections: Moderately Integrated (07/31/2023)   Social Connection and Isolation Panel    Frequency of Communication with Friends and Family: Three times a week    Frequency of Social Gatherings with Friends and Family: Three times a week    Attends Religious Services: 1 to 4 times per year    Active Member of Clubs or Organizations: No    Attends Banker Meetings: Never    Marital Status: Married  Catering Manager Violence: Not At Risk (07/31/2023)   Humiliation, Afraid, Rape, and Kick questionnaire    Fear of Current or Ex-Partner: No    Emotionally Abused: No    Physically Abused: No    Sexually Abused: No  Depression (PHQ2-9): Low Risk (09/23/2024)   Depression (PHQ2-9)    PHQ-2 Score: 0  Recent Concern: Depression (PHQ2-9) - High Risk (09/23/2024)   Depression (PHQ2-9)    PHQ-2 Score: 27  Alcohol Screen: Low Risk (07/31/2023)   Alcohol Screen    Last Alcohol Screening Score (AUDIT): 0  Housing: Unknown (12/04/2023)   Received from [facility]   Epic    In the last 12 months, was there a time when you were not able to pay the mortgage or rent on time?: No    Number of Times Moved in the Last Year: Not on file    At any time in the past 12 months, were you homeless or living in a shelter (including now)?: No  Utilities: Not At Risk (12/04/2023)   Received from [facility] Utilities    Threatened with loss of  utilities: No  Health Literacy: Adequate Health Literacy (07/31/2023)   B1300 Health Literacy    Frequency of need for help with medical instructions: Never   Social History   Tobacco Use  Smoking Status Never  Smokeless Tobacco Never   Social History   Substance and Sexual Activity  Alcohol  Use No    Family History:  Family History  Problem Relation Age of Onset   Diabetes Mother    Hypertension Mother    Congestive Heart Failure Mother    Stroke Father    Hyperlipidemia Father    Stroke Sister    Stroke Maternal Grandfather    Dementia Paternal Grandmother    Heart disease Paternal Grandfather     Past medical history, surgical history, medications, allergies, family history and social history reviewed with patient today and changes made to appropriate areas of the chart.   Review of Systems  Eyes:  Negative for blurred vision and double vision.  Respiratory:  Negative for shortness of breath.   Cardiovascular:  Negative for chest pain, palpitations and leg swelling.  Neurological:  Negative for dizziness and headaches.   All other ROS negative except what is listed above and in the HPI.      Objective:    BP 135/80 (BP Location: Right Arm, Patient Position: Sitting, Cuff Size: Large)   Pulse 73   Temp 98 F (36.7 C) (Oral)   Ht 4' 10.5 (1.486 m)   Wt 177 lb 3.2 oz (80.4 kg)   LMP 07/10/2024 (Approximate)   SpO2 99%   BMI 36.40 kg/m   Wt Readings from Last 3 Encounters:  09/23/24 177 lb 3.2 oz (80.4 kg)  01/09/24 172 lb 6.4 oz (78.2 kg)  11/29/23 158 lb (71.7 kg)    Physical Exam Vitals and nursing note reviewed.  Constitutional:      General: She is awake. She is not in acute distress.    Appearance: Normal appearance. She is well-developed. She is obese. She is not ill-appearing.  HENT:     Head: Normocephalic and atraumatic.     Right Ear: Hearing, tympanic membrane, ear canal and external ear normal. No drainage.     Left Ear: Hearing,  tympanic membrane, ear canal and external ear normal. No drainage.     Nose: Nose normal.     Right Sinus: No maxillary sinus tenderness or frontal sinus tenderness.     Left Sinus: No maxillary sinus tenderness or frontal sinus tenderness.     Mouth/Throat:     Mouth: Mucous membranes are moist.     Pharynx: Oropharynx is clear. Uvula midline. No pharyngeal swelling, oropharyngeal exudate or posterior oropharyngeal erythema.  Eyes:     General: Lids are normal.        Right eye: No discharge.        Left eye: No discharge.     Extraocular Movements: Extraocular movements intact.     Conjunctiva/sclera: Conjunctivae normal.     Pupils: Pupils are equal, round, and reactive to light.     Visual Fields: Right eye visual fields normal and left eye visual fields normal.  Neck:     Thyroid: No thyromegaly.     Vascular: No carotid bruit.     Trachea: Trachea normal.  Cardiovascular:     Rate and Rhythm: Normal rate and regular rhythm.     Heart sounds: Normal heart sounds. No murmur heard.    No gallop.  Pulmonary:     Effort: Pulmonary effort is normal. No accessory muscle usage or respiratory distress.     Breath sounds: Normal breath sounds.  Chest:  Breasts:    Right: Normal.     Left: Normal.  Abdominal:     General: Bowel sounds are normal.     Palpations: Abdomen is soft. There is no hepatomegaly  or splenomegaly.     Tenderness: There is no abdominal tenderness.  Musculoskeletal:        General: Normal range of motion.     Cervical back: Normal range of motion and neck supple.     Right lower leg: No edema.     Left lower leg: No edema.  Lymphadenopathy:     Head:     Right side of head: No submental, submandibular, tonsillar, preauricular or posterior auricular adenopathy.     Left side of head: No submental, submandibular, tonsillar, preauricular or posterior auricular adenopathy.     Cervical: No cervical adenopathy.     Upper Body:     Right upper body: No  supraclavicular, axillary or pectoral adenopathy.     Left upper body: No supraclavicular, axillary or pectoral adenopathy.  Skin:    General: Skin is warm and dry.     Capillary Refill: Capillary refill takes less than 2 seconds.     Findings: No rash.  Neurological:     Mental Status: She is alert and oriented to person, place, and time.     Gait: Gait is intact.     Deep Tendon Reflexes: Reflexes are normal and symmetric.     Reflex Scores:      Brachioradialis reflexes are 2+ on the right side and 2+ on the left side.      Patellar reflexes are 2+ on the right side and 2+ on the left side. Psychiatric:        Attention and Perception: Attention normal.        Mood and Affect: Mood normal.        Speech: Speech normal.        Behavior: Behavior normal. Behavior is cooperative.        Thought Content: Thought content normal.        Judgment: Judgment normal.     Results for orders placed or performed in visit on 01/09/24  Comp Met (CMET)   Collection Time: 01/09/24 10:38 AM  Result Value Ref Range   Glucose 85 70 - 99 mg/dL   BUN 9 6 - 24 mg/dL   Creatinine, Ser 9.08 0.57 - 1.00 mg/dL   eGFR 75 >40 fO/fpw/8.26   BUN/Creatinine Ratio 10 9 - 23   Sodium 140 134 - 144 mmol/L   Potassium 4.5 3.5 - 5.2 mmol/L   Chloride 106 96 - 106 mmol/L   CO2 19 (L) 20 - 29 mmol/L   Calcium 9.8 8.7 - 10.2 mg/dL   Total Protein 7.1 6.0 - 8.5 g/dL   Albumin 4.6 3.8 - 4.9 g/dL   Globulin, Total 2.5 1.5 - 4.5 g/dL   Bilirubin Total 0.4 0.0 - 1.2 mg/dL   Alkaline Phosphatase 130 (H) 44 - 121 IU/L   AST 29 0 - 40 IU/L   ALT 41 (H) 0 - 32 IU/L      Assessment & Plan:   Problem List Items Addressed This Visit       Cardiovascular and Mediastinum   Hypertension   Chronic.  Controlled.  Continue with current medication regimen of Olmesartan  and Amlodipine .  Refills sent today.  Labs ordered today.  Return to clinic in 6 months for reevaluation.  Call sooner if concerns arise.        Relevant Medications   amLODipine  (NORVASC ) 5 MG tablet   olmesartan  (BENICAR ) 20 MG tablet   Other Relevant Orders   Comp Met (CMET)     Other  Hypercholesteremia   Chronic.  Controlled.  Continue with current medication regimen.  Labs ordered today.  Return to clinic in 6 months for reevaluation.  Call sooner if concerns arise.        Relevant Medications   amLODipine  (NORVASC ) 5 MG tablet   olmesartan  (BENICAR ) 20 MG tablet   Other Relevant Orders   Lipid Profile   Other Visit Diagnoses       Annual physical exam    -  Primary   Health maintenance reviewed during visit today.  Labs ordered.  Vaccines reviewed.  PAP up to date.     Elevated glucose       Labs ordered.  Will make recommendations based on results.   Relevant Orders   HgB A1c     Elevated alkaline phosphatase level       Labs ordered.  Will make recommendations based on results.   Relevant Orders   PTH, Intact and Calcium   Gamma GT   Vitamin D  (25 hydroxy)        Follow up plan: Return in about 6 months (around 03/24/2025) for HTN, HLD, DM2 FU.   LABORATORY TESTING:  - Pap smear: up to date- will abstract  IMMUNIZATIONS:   - Tdap: Tetanus vaccination status reviewed: last tetanus booster within 10 years. - Influenza: Refused - Pneumovax: Not applicable - Prevnar: Not applicable - COVID:Refused - HPV: Not applicable - Shingrix vaccine: Will get at later date  SCREENING: -Mammogram: Ordered today - Colonoscopy: up to date - Bone Density: Not applicable  -Hearing Test: Not applicable  -Spirometry: Not applicable   PATIENT COUNSELING:   Advised to take 1 mg of folate supplement per day if capable of pregnancy.   Sexuality: Discussed sexually transmitted diseases, partner selection, use of condoms, avoidance of unintended pregnancy  and contraceptive alternatives.   Advised to avoid cigarette smoking.  I discussed with the patient that most people either abstain from alcohol or drink  within safe limits (<=14/week and <=4 drinks/occasion for males, <=7/weeks and <= 3 drinks/occasion for females) and that the risk for alcohol disorders and other health effects rises proportionally with the number of drinks per week and how often a drinker exceeds daily limits.  Discussed cessation/primary prevention of drug use and availability of treatment for abuse.   Diet: Encouraged to adjust caloric intake to maintain  or achieve ideal body weight, to reduce intake of dietary saturated fat and total fat, to limit sodium intake by avoiding high sodium foods and not adding table salt, and to maintain adequate dietary potassium and calcium preferably from fresh fruits, vegetables, and low-fat dairy products.    stressed the importance of regular exercise  Injury prevention: Discussed safety belts, safety helmets, smoke detector, smoking near bedding or upholstery.   Dental health: Discussed importance of regular tooth brushing, flossing, and dental visits.    NEXT PREVENTATIVE PHYSICAL DUE IN 1 YEAR. Return in about 6 months (around 03/24/2025) for HTN, HLD, DM2 FU.

## 2024-09-23 NOTE — Assessment & Plan Note (Signed)
 Chronic.  Controlled.  Continue with current medication regimen of Olmesartan and Amlodipine.  Refills sent today.  Labs ordered today.  Return to clinic in 6 months for reevaluation.  Call sooner if concerns arise.

## 2024-09-23 NOTE — Assessment & Plan Note (Signed)
 Chronic.  Controlled.  Continue with current medication regimen.  Labs ordered today.  Return to clinic in 6 months for reevaluation.  Call sooner if concerns arise.  ? ?

## 2024-09-24 ENCOUNTER — Ambulatory Visit: Payer: Self-pay | Admitting: Nurse Practitioner

## 2024-09-24 DIAGNOSIS — R748 Abnormal levels of other serum enzymes: Secondary | ICD-10-CM

## 2024-09-24 LAB — VITAMIN D 25 HYDROXY (VIT D DEFICIENCY, FRACTURES): Vit D, 25-Hydroxy: 15 ng/mL — ABNORMAL LOW (ref 30.0–100.0)

## 2024-09-24 LAB — LIPID PANEL
Chol/HDL Ratio: 6.5 ratio — ABNORMAL HIGH (ref 0.0–4.4)
Cholesterol, Total: 194 mg/dL (ref 100–199)
HDL: 30 mg/dL — ABNORMAL LOW (ref >39)
LDL Chol Calc (NIH): 125 mg/dL — ABNORMAL HIGH (ref 0–99)
Triglycerides: 221 mg/dL — ABNORMAL HIGH (ref 0–149)
VLDL Cholesterol Cal: 39 mg/dL (ref 5–40)

## 2024-09-24 LAB — COMPREHENSIVE METABOLIC PANEL WITH GFR
ALT: 43 IU/L — ABNORMAL HIGH (ref 0–32)
AST: 39 IU/L (ref 0–40)
Albumin: 4.5 g/dL (ref 3.8–4.9)
Alkaline Phosphatase: 120 IU/L (ref 49–135)
BUN/Creatinine Ratio: 12 (ref 9–23)
BUN: 11 mg/dL (ref 6–24)
Bilirubin Total: 0.3 mg/dL (ref 0.0–1.2)
CO2: 21 mmol/L (ref 20–29)
Calcium: 9.5 mg/dL (ref 8.7–10.2)
Chloride: 106 mmol/L (ref 96–106)
Creatinine, Ser: 0.91 mg/dL (ref 0.57–1.00)
Globulin, Total: 2.2 g/dL (ref 1.5–4.5)
Glucose: 119 mg/dL — ABNORMAL HIGH (ref 70–99)
Potassium: 4 mmol/L (ref 3.5–5.2)
Sodium: 142 mmol/L (ref 134–144)
Total Protein: 6.7 g/dL (ref 6.0–8.5)
eGFR: 75 mL/min/1.73 (ref >59)

## 2024-09-24 LAB — HEMOGLOBIN A1C
Est. average glucose Bld gHb Est-mCnc: 114 mg/dL
Hgb A1c MFr Bld: 5.6 % (ref 4.8–5.6)

## 2024-09-24 LAB — PTH, INTACT AND CALCIUM: PTH: 36 pg/mL (ref 15–65)

## 2024-09-24 LAB — GAMMA GT: GGT: 82 IU/L — ABNORMAL HIGH (ref 0–60)

## 2024-09-24 MED ORDER — VITAMIN D (ERGOCALCIFEROL) 1.25 MG (50000 UNIT) PO CAPS: 50000.0000 [IU] | ORAL_CAPSULE | ORAL | 0 refills | Status: AC

## 2024-09-26 ENCOUNTER — Telehealth: Payer: Self-pay

## 2024-09-26 ENCOUNTER — Ambulatory Visit

## 2024-09-26 NOTE — Telephone Encounter (Signed)
 Referral team- do you guys know where this would need to be sent to by chance?

## 2024-09-26 NOTE — Telephone Encounter (Signed)
 Copied from CRM #[id number]. Topic: Clinical - Request for Lab/Test Order >> Sep 26, 2024  2:56 PM Sherry Bass wrote: Reason for CRM: Pt called requesting to have her Ultrasound orders sent to [facility].   Also says her MyChart is not allowing her to message her PCP. Please call instead   Best contact: [phone]

## 2024-09-26 NOTE — Telephone Encounter (Signed)
 Can order be changed for [facility] please?

## 2024-09-27 NOTE — Telephone Encounter (Signed)
 Yes that is fine. I don't know how to order it for [facility]. Do you?

## 2024-10-01 NOTE — Telephone Encounter (Signed)
 Updated. Referrals, can you assist with scheduling or does patient need to follow up herself?

## 2024-10-02 NOTE — Telephone Encounter (Signed)
 Spoke with patient and she is aware. She will speak with insurance and if they will not allow her to complete at a preferred location that she will need to wait till the new year with new insurance.   Patient also mentioned that she took her first weekly 50,000 international units  Vitamin D  and within in hour had minor facial/eye puffiness and some redness that resolved in two days. She asked if was ok to continue and will so unless told otherwise.

## 2024-10-04 NOTE — Telephone Encounter (Signed)
 Called patient, and she did state the vitamin D  is no longer an issue. She said her eyes were only puffy that one time, and she is going to continue the supplement.

## 2024-10-04 NOTE — Telephone Encounter (Signed)
 She should stop the vitamin D  if there were no other changes made that could have caused the reaction.

## 2024-11-11 ENCOUNTER — Other Ambulatory Visit: Payer: Self-pay | Admitting: Nurse Practitioner

## 2024-11-12 NOTE — Telephone Encounter (Signed)
 Too soon for refill.  Requested Prescriptions  Pending Prescriptions Disp Refills   olmesartan  (BENICAR ) 20 MG tablet [Pharmacy Med Name: OLMESARTAN  MEDOXOMIL 20MG  TABLETS] 90 tablet 1    Sig: TAKE 1 TABLET(20 MG) BY MOUTH DAILY     Cardiovascular:  Angiotensin Receptor Blockers Passed - 11/12/2024  4:19 PM      Passed - Cr in normal range and within 180 days    Creatinine, Ser  Date Value Ref Range Status  09/23/2024 0.91 0.57 - 1.00 mg/dL Final         Passed - K in normal range and within 180 days    Potassium  Date Value Ref Range Status  09/23/2024 4.0 3.5 - 5.2 mmol/L Final         Passed - Patient is not pregnant      Passed - Last BP in normal range    BP Readings from Last 1 Encounters:  09/23/24 135/80         Passed - Valid encounter within last 6 months    Recent Outpatient Visits           1 month ago Annual physical exam   [facility] Melvin Pao, NP   10 months ago Primary hypertension   [facility] Melvin Pao, NP   11 months ago Primary hypertension   [facility] Melvin Pao, NP
